# Patient Record
Sex: Male | Born: 1960 | Race: White | Hispanic: No | Marital: Single | Smoking: Former smoker
Health system: Southern US, Community
[De-identification: ages and names within clinical notes are randomized; demographics above are authoritative.]

## PROBLEM LIST (undated history)

## (undated) DIAGNOSIS — Z87442 Personal history of urinary calculi: Secondary | ICD-10-CM

## (undated) DIAGNOSIS — Z8619 Personal history of other infectious and parasitic diseases: Secondary | ICD-10-CM

## (undated) DIAGNOSIS — I10 Essential (primary) hypertension: Secondary | ICD-10-CM

## (undated) DIAGNOSIS — E785 Hyperlipidemia, unspecified: Secondary | ICD-10-CM

## (undated) DIAGNOSIS — E7800 Pure hypercholesterolemia, unspecified: Secondary | ICD-10-CM

## (undated) HISTORY — PX: HERNIA REPAIR: SHX51

## (undated) HISTORY — DX: Hyperlipidemia, unspecified: E78.5

## (undated) HISTORY — DX: Essential (primary) hypertension: I10

## (undated) HISTORY — DX: Pure hypercholesterolemia, unspecified: E78.00

---

## 2006-01-14 ENCOUNTER — Ambulatory Visit: Payer: Self-pay | Admitting: Otolaryngology

## 2009-04-13 ENCOUNTER — Emergency Department: Payer: Self-pay | Admitting: Emergency Medicine

## 2009-11-11 ENCOUNTER — Ambulatory Visit: Payer: Self-pay | Admitting: Urology

## 2011-10-21 ENCOUNTER — Other Ambulatory Visit: Payer: Self-pay | Admitting: Ophthalmology

## 2011-10-21 LAB — CBC WITH DIFFERENTIAL/PLATELET
Basophil #: 0.1 10*3/uL (ref 0.0–0.1)
Eosinophil #: 0.3 10*3/uL (ref 0.0–0.7)
Eosinophil %: 3.4 %
HCT: 44.3 % (ref 40.0–52.0)
HGB: 15.1 g/dL (ref 13.0–18.0)
Lymphocyte #: 1.7 10*3/uL (ref 1.0–3.6)
MCH: 31.9 pg (ref 26.0–34.0)
MCV: 94 fL (ref 80–100)
Monocyte #: 1.2 10*3/uL — ABNORMAL HIGH (ref 0.0–0.7)
Monocyte %: 11.9 %
Neutrophil #: 6.9 10*3/uL — ABNORMAL HIGH (ref 1.4–6.5)
Neutrophil %: 67.8 %
Platelet: 353 10*3/uL (ref 150–440)
RDW: 13.3 % (ref 11.5–14.5)
WBC: 10.2 10*3/uL (ref 3.8–10.6)

## 2012-10-10 HISTORY — PX: COLONOSCOPY: SHX174

## 2013-07-22 ENCOUNTER — Ambulatory Visit: Payer: Self-pay | Admitting: Gastroenterology

## 2014-03-13 ENCOUNTER — Emergency Department: Payer: Self-pay | Admitting: Emergency Medicine

## 2014-03-13 LAB — URINALYSIS, COMPLETE
Bacteria: NONE SEEN
Bilirubin,UR: NEGATIVE
Glucose,UR: NEGATIVE mg/dL (ref 0–75)
Ketone: NEGATIVE
LEUKOCYTE ESTERASE: NEGATIVE
Nitrite: NEGATIVE
Ph: 5 (ref 4.5–8.0)
Protein: 30
RBC,UR: 334 /HPF (ref 0–5)
Specific Gravity: 1.018 (ref 1.003–1.030)
WBC UR: 2 /HPF (ref 0–5)

## 2014-03-13 LAB — BASIC METABOLIC PANEL
Anion Gap: 4 — ABNORMAL LOW (ref 7–16)
BUN: 6 mg/dL — ABNORMAL LOW (ref 7–18)
CHLORIDE: 107 mmol/L (ref 98–107)
CO2: 30 mmol/L (ref 21–32)
Calcium, Total: 9.2 mg/dL (ref 8.5–10.1)
Creatinine: 1.09 mg/dL (ref 0.60–1.30)
EGFR (Non-African Amer.): >60
Glucose: 91 mg/dL (ref 65–99)
Osmolality: 278 (ref 275–301)
POTASSIUM: 3.7 mmol/L (ref 3.5–5.1)
SODIUM: 141 mmol/L (ref 136–145)

## 2014-03-13 LAB — CBC
HCT: 44.3 % (ref 40.0–52.0)
HGB: 15.3 g/dL (ref 13.0–18.0)
MCH: 32 pg (ref 26.0–34.0)
MCHC: 34.6 g/dL (ref 32.0–36.0)
MCV: 93 fL (ref 80–100)
Platelet: 302 10*3/uL (ref 150–440)
RBC: 4.78 10*6/uL (ref 4.40–5.90)
RDW: 13.3 % (ref 11.5–14.5)
WBC: 6.7 10*3/uL (ref 3.8–10.6)

## 2014-06-06 ENCOUNTER — Ambulatory Visit: Payer: Self-pay | Admitting: Family Medicine

## 2014-06-26 ENCOUNTER — Ambulatory Visit: Payer: Self-pay | Admitting: Family Medicine

## 2014-10-26 ENCOUNTER — Emergency Department: Payer: Self-pay | Admitting: Emergency Medicine

## 2014-10-26 LAB — CBC WITH DIFFERENTIAL/PLATELET
BASOS PCT: 0.3 %
Basophil #: 0 10*3/uL (ref 0.0–0.1)
EOS PCT: 1.3 %
Eosinophil #: 0.1 10*3/uL (ref 0.0–0.7)
HCT: 48.5 % (ref 40.0–52.0)
HGB: 16.3 g/dL (ref 13.0–18.0)
LYMPHS ABS: 0.2 10*3/uL — AB (ref 1.0–3.6)
Lymphocyte %: 2.2 %
MCH: 31.3 pg (ref 26.0–34.0)
MCHC: 33.6 g/dL (ref 32.0–36.0)
MCV: 93 fL (ref 80–100)
MONO ABS: 0.5 x10 3/mm (ref 0.2–1.0)
Monocyte %: 4.7 %
NEUTROS ABS: 9 10*3/uL — AB (ref 1.4–6.5)
Neutrophil %: 91.5 %
Platelet: 273 10*3/uL (ref 150–440)
RBC: 5.2 10*6/uL (ref 4.40–5.90)
RDW: 13.4 % (ref 11.5–14.5)
WBC: 9.8 10*3/uL (ref 3.8–10.6)

## 2014-10-26 LAB — COMPREHENSIVE METABOLIC PANEL
ALBUMIN: 3.7 g/dL (ref 3.4–5.0)
Alkaline Phosphatase: 93 U/L
Anion Gap: 9 (ref 7–16)
BUN: 12 mg/dL (ref 7–18)
Bilirubin,Total: 0.9 mg/dL (ref 0.2–1.0)
CALCIUM: 8.8 mg/dL (ref 8.5–10.1)
CHLORIDE: 104 mmol/L (ref 98–107)
CO2: 27 mmol/L (ref 21–32)
Creatinine: 1.31 mg/dL — ABNORMAL HIGH (ref 0.60–1.30)
EGFR (African American): >60
Glucose: 144 mg/dL — ABNORMAL HIGH (ref 65–99)
OSMOLALITY: 282 (ref 275–301)
POTASSIUM: 3.9 mmol/L (ref 3.5–5.1)
SGOT(AST): 32 U/L (ref 15–37)
SGPT (ALT): 46 U/L
SODIUM: 140 mmol/L (ref 136–145)
Total Protein: 7.6 g/dL (ref 6.4–8.2)

## 2014-10-26 LAB — URINALYSIS, COMPLETE
Bilirubin,UR: NEGATIVE
Glucose,UR: NEGATIVE mg/dL (ref 0–75)
LEUKOCYTE ESTERASE: NEGATIVE
Nitrite: NEGATIVE
PROTEIN: NEGATIVE
Ph: 5 (ref 4.5–8.0)
Specific Gravity: 1.02 (ref 1.003–1.030)
Squamous Epithelial: <1
WBC UR: <1 /HPF (ref 0–5)

## 2014-10-26 LAB — LIPASE, BLOOD: Lipase: 214 U/L (ref 73–393)

## 2014-10-26 LAB — TROPONIN I: Troponin-I: <0.02 ng/mL

## 2014-10-27 LAB — ED INFLUENZA
H1N1 flu by pcr: NOT DETECTED
Influenza A By PCR: NEGATIVE
Influenza B By PCR: NEGATIVE

## 2014-10-28 LAB — URINE CULTURE

## 2014-11-01 LAB — CULTURE, BLOOD (SINGLE)

## 2014-12-30 ENCOUNTER — Emergency Department: Payer: Self-pay | Admitting: Internal Medicine

## 2015-04-10 ENCOUNTER — Ambulatory Visit (INDEPENDENT_AMBULATORY_CARE_PROVIDER_SITE_OTHER): Payer: Managed Care, Other (non HMO) | Admitting: Family Medicine

## 2015-04-10 ENCOUNTER — Encounter: Payer: Self-pay | Admitting: Family Medicine

## 2015-04-10 VITALS — BP 122/70 | HR 60 | Temp 98.4°F | Ht 65.0 in | Wt 227.4 lb

## 2015-04-10 DIAGNOSIS — E785 Hyperlipidemia, unspecified: Secondary | ICD-10-CM | POA: Diagnosis not present

## 2015-04-10 DIAGNOSIS — R7989 Other specified abnormal findings of blood chemistry: Secondary | ICD-10-CM | POA: Insufficient documentation

## 2015-04-10 DIAGNOSIS — J90 Pleural effusion, not elsewhere classified: Secondary | ICD-10-CM | POA: Insufficient documentation

## 2015-04-10 DIAGNOSIS — R4586 Emotional lability: Secondary | ICD-10-CM | POA: Insufficient documentation

## 2015-04-10 DIAGNOSIS — I10 Essential (primary) hypertension: Secondary | ICD-10-CM

## 2015-04-10 DIAGNOSIS — R739 Hyperglycemia, unspecified: Secondary | ICD-10-CM | POA: Insufficient documentation

## 2015-04-10 DIAGNOSIS — E559 Vitamin D deficiency, unspecified: Secondary | ICD-10-CM | POA: Insufficient documentation

## 2015-04-10 MED ORDER — NEBIVOLOL HCL 10 MG PO TABS: 10.0000 mg | ORAL_TABLET | Freq: Every day | ORAL | Status: AC

## 2015-04-10 MED ORDER — SIMVASTATIN 40 MG PO TABS: 40.0000 mg | ORAL_TABLET | Freq: Every day | ORAL | Status: AC

## 2015-04-10 NOTE — Progress Notes (Signed)
Name: Larry Webb   MRN: [id number]    DOB: 1960-10-18   Date:04/10/2015       Progress Note  Subjective  Chief Complaint  Chief Complaint  Patient presents with  . Follow-up    Dr Jacqualine Code patient    Hypertension This is a chronic problem. The problem is controlled. Pertinent negatives include no chest pain, headaches, palpitations or shortness of breath. Past treatments include beta blockers. The current treatment provides significant improvement. There is no history of CAD/MI or CVA.  Hyperlipidemia This is a chronic problem. The problem is controlled. Recent lipid tests were reviewed and are normal. Pertinent negatives include no chest pain, leg pain, myalgias or shortness of breath. Current antihyperlipidemic treatment includes statins, diet change and exercise. The current treatment provides significant improvement of lipids. There are no compliance problems.  Risk factors for coronary artery disease include dyslipidemia, male sex and obesity.      Past Medical History  Diagnosis Date  . Hypertension   . Hyperlipidemia     History reviewed. No pertinent past surgical history.  Family History  Problem Relation Age of Onset  . Vision loss Mother   . Hearing loss Mother   . Pneumonia Father     History   Social History  . Marital Status: Single    Spouse Name: N/A  . Number of Children: N/A  . Years of Education: N/A   Occupational History  . Not on file.   Social History Main Topics  . Smoking status: Never Smoker   . Smokeless tobacco: Never Used  . Alcohol Use: 0.0 oz/week    0 Standard drinks or equivalent per week  . Drug Use: No  . Sexual Activity: Not on file   Other Topics Concern  . Not on file   Social History Narrative  . No narrative on file     Current outpatient prescriptions:  .  nebivolol (BYSTOLIC) 10 MG tablet, Take by mouth., Disp: , Rfl:  .  simvastatin (ZOCOR) 40 MG tablet, Take by mouth., Disp: , Rfl:  .  Cholecalciferol 1000  UNITS tablet, Take by mouth., Disp: , Rfl:  .  ferrous sulfate 324 (65 FE) MG TBEC, Take by mouth., Disp: , Rfl:  .  fluticasone (FLONASE) 50 MCG/ACT nasal spray, SPRAY ONCE IN EACH NOSTRIL ONCE DAILY, Disp: , Rfl: 19 .  MULTIPLE VITAMIN PO, Take by mouth., Disp: , Rfl:   Not on File   Review of Systems  Respiratory: Negative for shortness of breath.   Cardiovascular: Negative for chest pain and palpitations.  Musculoskeletal: Negative for myalgias.  Neurological: Negative for headaches.      Objective  Filed Vitals:   04/10/15 0824  BP: 122/70  Pulse: 60  Temp: 98.4 F (36.9 C)  TempSrc: Oral  Height: 5\' 5"  (1.651 m)  Weight: 227 lb 6.4 oz (103.148 kg)  SpO2: 96%    Physical Exam  Constitutional: He is oriented to person, place, and time and well-developed, well-nourished, and in no distress.  HENT:  Head: Normocephalic and atraumatic.  Cardiovascular: Normal rate and regular rhythm.   Pulmonary/Chest: Effort normal and breath sounds normal.  Neurological: He is alert and oriented to person, place, and time.  Skin: Skin is warm and dry.  Nursing note and vitals reviewed.      No results found for this or any previous visit (from the past 2160 hour(s)).   Assessment & Plan 1. Essential hypertension Blood Pressure controlled on present therapy. -  nebivolol (BYSTOLIC) 10 MG tablet; Take 1 tablet (10 mg total) by mouth daily.  Dispense: 90 tablet; Refill: 1  2. HLD (hyperlipidemia) Lipid panel from March 2016 was reviewed and is at goal. Continue simvastatin 40 mg at bedtime and repeat fasting lipid panel in December 2016. - simvastatin (ZOCOR) 40 MG tablet; Take 1 tablet (40 mg total) by mouth daily at 6 PM.  Dispense: 90 tablet; Refill: 1  .  [name] Asad A[facility] 04/10/2015 8:40 AM

## 2015-06-05 IMAGING — CT CT NECK WITH CONTRAST
3 of 5 series · 13 of 33 positions shown, 16 images · IV contrast (omnipaque)
Comparison: Chest radiograph performed 10/26/2014

CLINICAL DATA: Acute onset of throat swelling, particularly at the
soft palate. Thrown scratchiness. Initial encounter.

EXAM:
CT NECK WITH CONTRAST
TECHNIQUE: Multidetector CT imaging of the neck was performed using the
standard protocol following the bolus administration of intravenous
contrast.
CONTRAST:  75 mL of Omnipaque 300 IV contrast

[Series 5: sag neck · sagittal · 0.61mm/px · 5 of 147 slices shown, 6 images]
[im 49/147  bone]
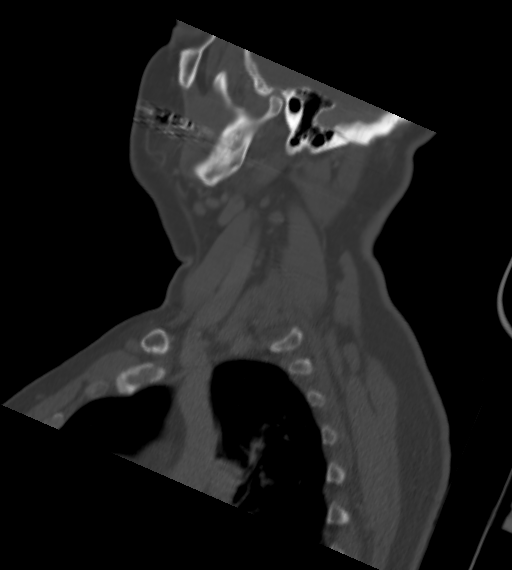
[im 61/147  bone]
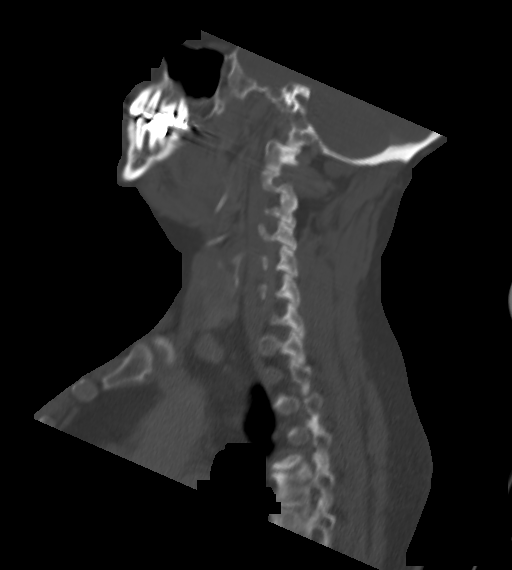
[im 74/147  soft-tissue]
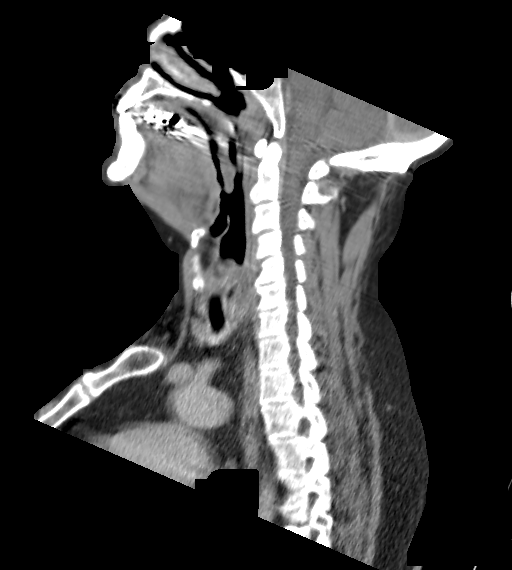
[im 74/147  bone]
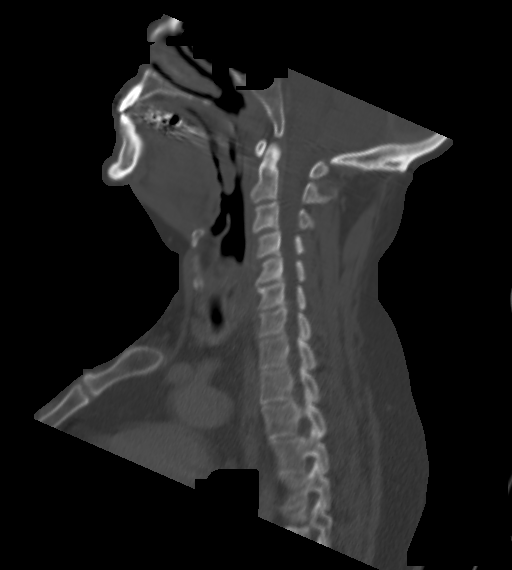
[im 86/147  bone]
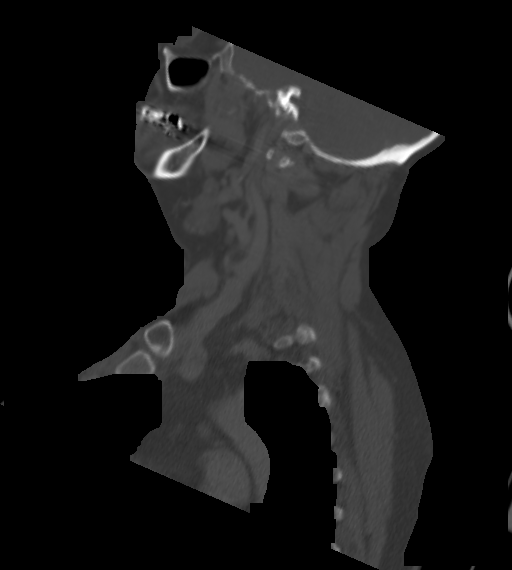
[im 98/147  bone]
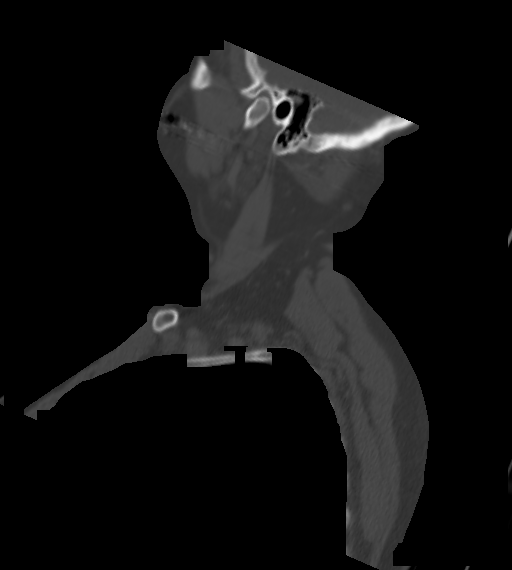

[Series 6: cor neck · coronal · 0.63mm/px · 3 of 122 slices shown]
[im 42/122  bone]
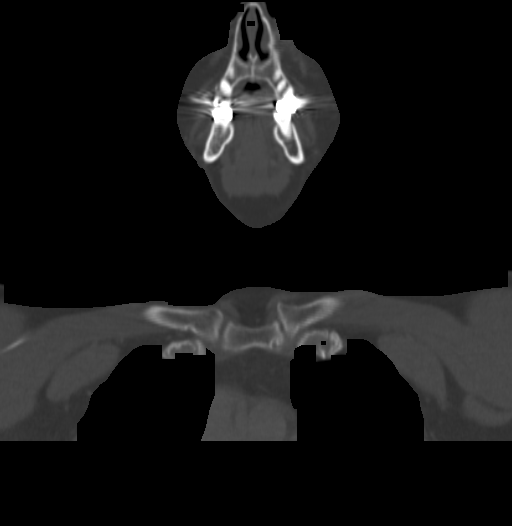
[im 55/122  bone]
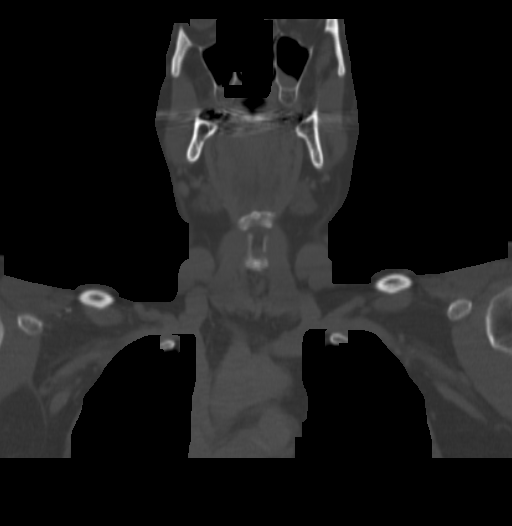
[im 68/122  bone]
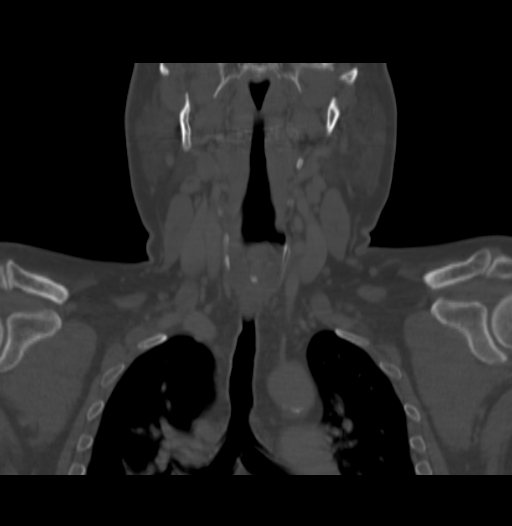

[Series 7: ax oropharynx · axial · 0.50mm/px · z∈[-372,-162]mm · 5 of 174 slices shown, 7 images]
[im 29/174  soft-tissue]
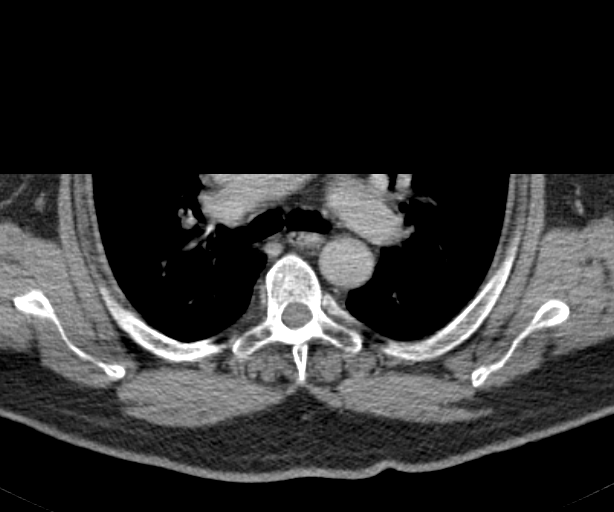
[im 29/174  bone]
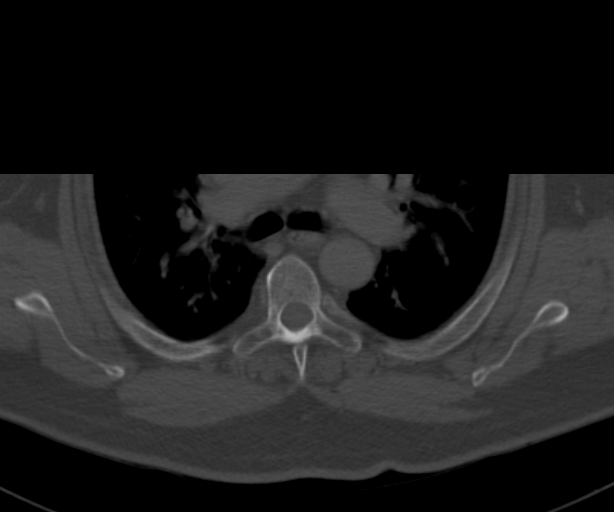
[im 58/174  bone]
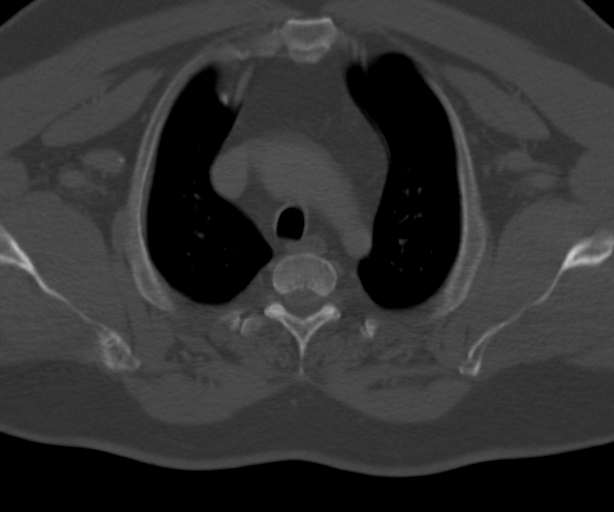
[im 87/174  bone]
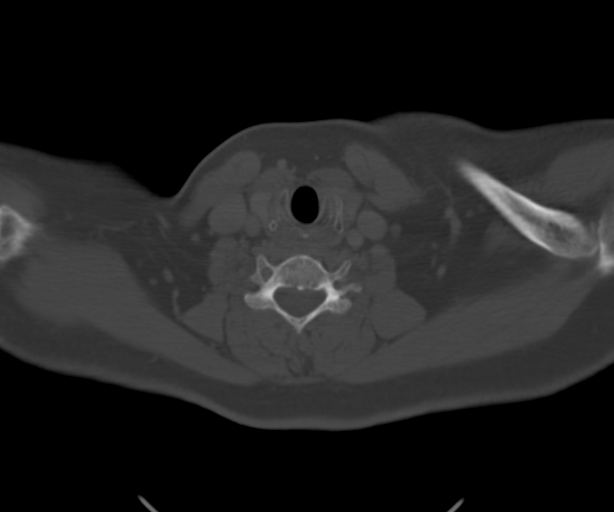
[im 116/174  bone]
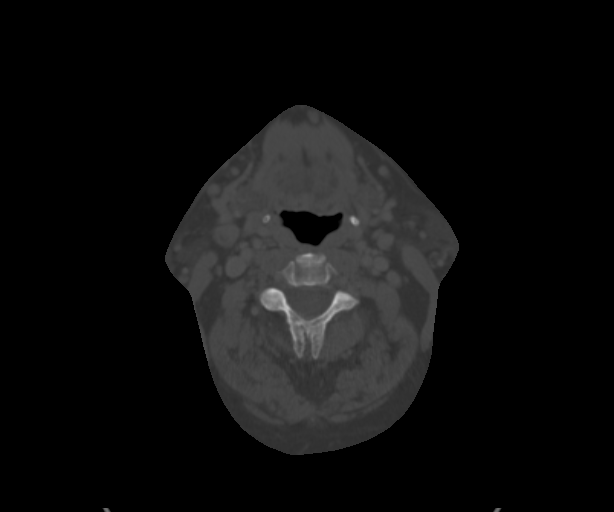
[im 145/174  soft-tissue]
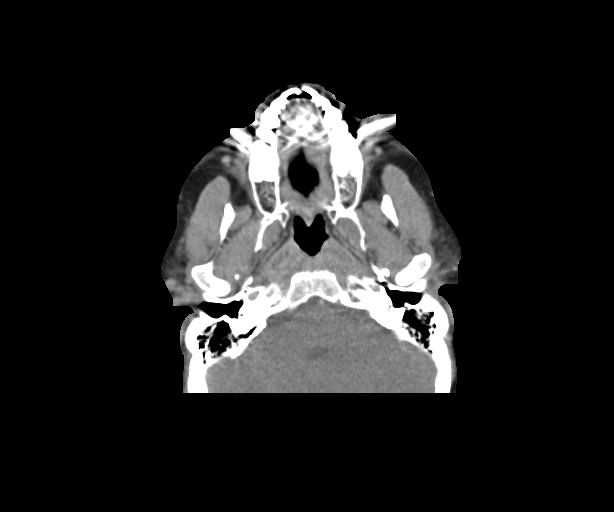
[im 145/174  bone]
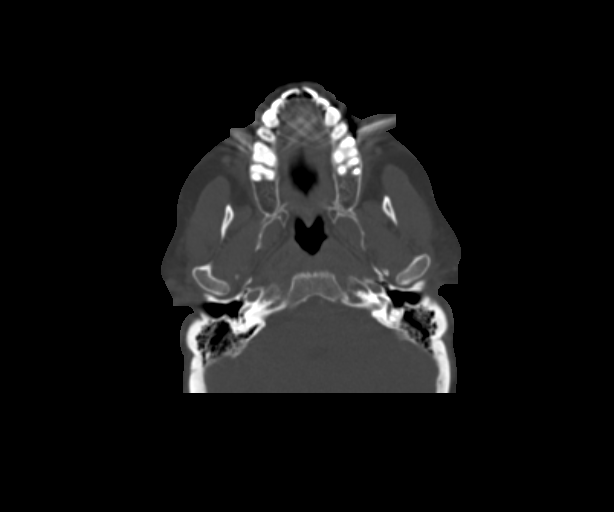

[13 of 33 positions shown; findings below may reference images not displayed]

FINDINGS: Pharynx and larynx: The nasopharynx, oropharynx and hypopharynx are
grossly unremarkable in appearance. The adenoids and palatine
tonsils are grossly unremarkable. The valleculae and piriform
sinuses are within normal limits. The proximal trachea is
unremarkable in appearance. The parapharyngeal fat planes are
preserved. Prevertebral soft tissues are within normal limits.

Salivary glands: The parotid and submandibular glands are symmetric
and unremarkable in appearance.

Thyroid: The thyroid gland is unremarkable in appearance.

Lymph nodes: A borderline prominent 1.1 cm right submandibular node
is noted. Additional visualized cervical nodes remain normal in
size.

Vascular: Visualized vasculature is unremarkable in appearance.
There is no evidence of vascular compromise. The visualized
intracranial vasculature is grossly unremarkable.

Limited intracranial: The visualized portions of the brain are
unremarkable in appearance.

Visualized orbits: The visualized portions of the orbits are within
normal limits.

Mastoids and visualized paranasal sinuses: Mucosal thickening is
noted at the maxillary sinuses bilaterally. The remaining visualized
paranasal sinuses and mastoid air cells are well-aerated.

Skeleton: No acute osseous abnormalities are seen. The visualized
dentition is grossly unremarkable, though difficult to fully assess.

Upper chest: The visualized portions of the lungs are clear. The
superior mediastinum is unremarkable in appearance. Incidental note
is made of a direct origin of the left vertebral artery from the
aortic arch. Note is made of a partially characterized 4.8 cm lipoma
at the right axilla.
IMPRESSION: 1. No acute abnormality seen to explain the patient's symptoms.
2. Borderline prominent 1.1 cm right submandibular node, nonspecific
and likely benign.
3. Mucosal thickening at the maxillary sinuses bilaterally.
4. Partially characterized 4.8 cm lipoma noted at the right axilla.

## 2017-12-22 ENCOUNTER — Other Ambulatory Visit: Payer: Self-pay | Admitting: Internal Medicine

## 2017-12-22 DIAGNOSIS — R3129 Other microscopic hematuria: Secondary | ICD-10-CM

## 2017-12-22 DIAGNOSIS — N200 Calculus of kidney: Secondary | ICD-10-CM

## 2018-01-03 ENCOUNTER — Ambulatory Visit
Admission: RE | Admit: 2018-01-03 | Discharge: 2018-01-03 | Disposition: A | Discharge status: Home / Self Care | Payer: Managed Care, Other (non HMO) | Source: Ambulatory Visit | Attending: Internal Medicine | Admitting: Internal Medicine

## 2018-01-03 DIAGNOSIS — N200 Calculus of kidney: Secondary | ICD-10-CM | POA: Diagnosis present

## 2018-01-03 DIAGNOSIS — N132 Hydronephrosis with renal and ureteral calculous obstruction: Secondary | ICD-10-CM | POA: Insufficient documentation

## 2018-01-03 DIAGNOSIS — I10 Essential (primary) hypertension: Secondary | ICD-10-CM | POA: Insufficient documentation

## 2018-01-03 DIAGNOSIS — R3129 Other microscopic hematuria: Secondary | ICD-10-CM | POA: Insufficient documentation

## 2018-01-08 ENCOUNTER — Other Ambulatory Visit: Payer: Self-pay

## 2018-01-12 ENCOUNTER — Ambulatory Visit
Admission: RE | Admit: 2018-01-12 | Discharge: 2018-01-12 | Disposition: A | Discharge status: Home / Self Care | Payer: Managed Care, Other (non HMO) | Source: Ambulatory Visit | Attending: Urology | Admitting: Urology

## 2018-01-12 ENCOUNTER — Ambulatory Visit: Payer: Managed Care, Other (non HMO) | Admitting: Urology

## 2018-01-12 ENCOUNTER — Encounter: Payer: Self-pay | Admitting: Urology

## 2018-01-12 VITALS — BP 134/78 | HR 61 | Ht 64.0 in | Wt 231.0 lb

## 2018-01-12 DIAGNOSIS — N202 Calculus of kidney with calculus of ureter: Secondary | ICD-10-CM | POA: Insufficient documentation

## 2018-01-12 DIAGNOSIS — N200 Calculus of kidney: Secondary | ICD-10-CM | POA: Diagnosis not present

## 2018-01-12 DIAGNOSIS — N201 Calculus of ureter: Secondary | ICD-10-CM

## 2018-01-12 DIAGNOSIS — N132 Hydronephrosis with renal and ureteral calculous obstruction: Secondary | ICD-10-CM | POA: Diagnosis not present

## 2018-01-12 LAB — URINALYSIS, COMPLETE
Bilirubin, UA: NEGATIVE
GLUCOSE, UA: NEGATIVE
Ketones, UA: NEGATIVE
LEUKOCYTES UA: NEGATIVE
Nitrite, UA: NEGATIVE
PROTEIN UA: NEGATIVE
Specific Gravity, UA: 1.025 (ref 1.005–1.030)
UUROB: 0.2 mg/dL (ref 0.2–1.0)
pH, UA: 5.5 (ref 5.0–7.5)

## 2018-01-12 LAB — MICROSCOPIC EXAMINATION
Bacteria, UA: NONE SEEN
Epithelial Cells (non renal): NONE SEEN /hpf (ref 0–10)
WBC UA: NONE SEEN /HPF (ref 0–5)

## 2018-01-12 NOTE — Progress Notes (Signed)
01/12/2018 4:21 PM   [name] [birth year] [id number]  Referring provider: Adin Hector, MD [facility] [place], [place] [address]  Chief Complaint  Patient presents with  . Hydronephrosis  . Follow-up    HPI: Larry Webb is a 57 year old male seen in consultation at the request of Dr. Caryl Comes for evaluation of nephrolithiasis and hydronephrosis.  He relates to a history of stone disease.  He recently saw Dr. Caryl Comes and brought in a stone he had passed which was sent for analysis and was 95% calcium oxalate monohydrate and 5% calcium phosphate.  He has had nagging right flank pain for the past 2-3 months.  He was found to have persistent microhematuria and a stone protocol CT of the abdomen and pelvis was performed on 01/03/2018 which showed a 6 mm lower right proximal ureteral calculus with moderate hydronephrosis/hydroureter.  He also had bilateral nonobstructing renal calculi measuring up to 4 mm in the right and 3 mm in the left.  He denies bothersome lower urinary tract symptoms, dysuria or gross hematuria.  His flank pain is rated mild without identifiable precipitating, aggravating or alleviating factors.  He denies fever, chills, nausea, vomiting.  He denies gross hematuria.  He has never required surgical intervention for his urinary calculi.   PMH: Past Medical History:  Diagnosis Date  . High cholesterol   . Hyperlipidemia   . Hypertension     Surgical History: Past Surgical History:  Procedure Laterality Date  . COLONOSCOPY  2014  . HERNIA REPAIR     right inguinal    Home Medications:  Allergies as of 01/12/2018   Not on File     Medication List        Accurate as of 01/12/18  4:21 PM. Always use your most recent med list.          MULTIPLE VITAMIN PO Take by mouth.   nebivolol 10 MG tablet Commonly known as:  BYSTOLIC Take 1 tablet (10 mg total) by mouth daily.   PROBIOTIC-10 PO Take by mouth.   simvastatin 40  MG tablet Commonly known as:  ZOCOR Take 1 tablet (40 mg total) by mouth daily at 6 PM.   St Johns Wort 1000 MG Caps Take by mouth.       Allergies: Not on File  Family History: Family History  Problem Relation Age of Onset  . Vision loss Mother   . Hearing loss Mother   . Pneumonia Father     Social History:  reports that he has quit smoking. He has never used smokeless tobacco. He reports that he drinks alcohol. He reports that he does not use drugs.  ROS: UROLOGY Frequent Urination?: No Hard to postpone urination?: No Burning/pain with urination?: No Get up at night to urinate?: No Leakage of urine?: No Urine stream starts and stops?: No Trouble starting stream?: No Do you have to strain to urinate?: No Blood in urine?: No Urinary tract infection?: No Sexually transmitted disease?: No Injury to kidneys or bladder?: No Painful intercourse?: No Weak stream?: No Erection problems?: No Penile pain?: No  Gastrointestinal Nausea?: No Vomiting?: No Indigestion/heartburn?: No Diarrhea?: No Constipation?: No  Constitutional Fever: No Night sweats?: No Weight loss?: No Fatigue?: Yes  Skin Skin rash/lesions?: No Itching?: No  Eyes Blurred vision?: No Double vision?: No  Ears/Nose/Throat Sore throat?: No Sinus problems?: No  Hematologic/Lymphatic Swollen glands?: No Easy bruising?: No  Cardiovascular Leg swelling?: No Chest pain?: No  Respiratory Cough?: No Shortness of breath?: No  Endocrine Excessive thirst?: No  Musculoskeletal Back pain?: No Joint pain?: No  Neurological Headaches?: No Dizziness?: No  Psychologic Depression?: Yes Anxiety?: Yes  Physical Exam: BP 134/78   Pulse 61   Ht 5\' 4"  (1.626 m)   Wt 231 lb (104.8 kg)   BMI 39.65 kg/m   Constitutional:  Alert and oriented, No acute distress. HEENT: [place] AT, moist mucus membranes.  Trachea midline, no masses. Cardiovascular: No clubbing, cyanosis, or edema.   RRR Respiratory: Normal respiratory effort, no increased work of breathing.  Lungs clear  GI: Abdomen is soft, nontender, nondistended, no abdominal masses GU: No CVA tenderness Lymph: No cervical or inguinal lymphadenopathy. Skin: No rashes, bruises or suspicious lesions. Neurologic: Grossly intact, no focal deficits, moving all 4 extremities. Psychiatric: Normal mood and affect.  Laboratory Data: Lab Results  Component Value Date   WBC 9.8 10/26/2014   HGB 16.3 10/26/2014   HCT 48.5 10/26/2014   MCV 93 10/26/2014   PLT 273 10/26/2014    Lab Results  Component Value Date   CREATININE 1.31 (H) 10/26/2014   Urinalysis No significant abnormalities on dipstick or microscopy  Pertinent Imaging: CT images were personally reviewed Results for orders placed during the hospital encounter of 01/03/18  CT RENAL STONE STUDY   Narrative CLINICAL DATA:  Microhematuria.  History of nephrolithiasis.  EXAM: CT ABDOMEN AND PELVIS WITHOUT CONTRAST  TECHNIQUE: Multidetector CT imaging of the abdomen and pelvis was performed following the standard protocol without IV contrast.  COMPARISON:  11/11/2009 CT abdomen/pelvis.  FINDINGS: Lower chest: No significant pulmonary nodules or acute consolidative airspace disease.  Hepatobiliary: Normal liver size. Several scattered simple liver cysts, largest 2.6 cm at the right liver dome. No additional liver lesions normal gallbladder with no radiopaque cholelithiasis. No biliary ductal dilatation.  Pancreas: Normal, with no mass or duct dilation.  Spleen: Normal size. No mass.  Adrenals/Urinary Tract: Normal adrenals. Obstructing 6 mm stone in the right lumbar ureter at the L5 level, with mild to moderate right hydroureteronephrosis. No left hydronephrosis. Scattered nonobstructing stones throughout the left greater than right kidneys, largest 4 mm in the lower right kidney and 3 mm in the upper left kidney. Exophytic simple 1.3 cm  anterior upper left renal cyst. No additional contour deforming renal lesions. Normal caliber left ureter. No additional ureteral stones. Normal bladder.  Stomach/Bowel: Normal non-distended stomach. Normal caliber small bowel with no small bowel wall thickening. Normal appendix. Normal large bowel with no diverticulosis, large bowel wall thickening or pericolonic fat stranding.  Vascular/Lymphatic: Normal caliber abdominal aorta. No pathologically enlarged lymph nodes in the abdomen or pelvis.  Reproductive: Normal size prostate with nonspecific coarse internal prostatic calcification.  Other: No pneumoperitoneum, ascites or focal fluid collection. Prior right inguinal hernia repair, with no evidence of a recurrent right inguinal hernia. Probable tiny fat containing left inguinal hernia. Small fat containing umbilical hernia.  Musculoskeletal: No aggressive appearing focal osseous lesions. Mild thoracolumbar spondylosis.  IMPRESSION: 1. Obstructing 6 mm stone in the lower lumbar segment of the right ureter, with mild to moderate right hydroureteronephrosis. 2. Several additional nonobstructing stones in the left greater than right kidneys.  These results will be called to the ordering clinician or representative by the Radiologist Assistant, and communication documented in the PACS or zVision Dashboard.   Electronically Signed   By: Ilona Sorrel M.D.   On: 01/04/2018 08:19     Assessment & Plan:   57 year old male with  an obstructing right ureteral calculus with hydronephrosis and bilateral nephrolithiasis.  I reviewed Larry Webb's CT with him.  Treatment of his right ureteral calculus was recommended.  I discussed both ureteroscopic removal and shockwave lithotripsy.  He was informed his stone would need to be visualized on plain x-ray in order for lithotripsy to be performed.  He was sent for KUB and will be notified with the results.  The pros and cons of each treatment  were discussed and he is leaning toward shockwave lithotripsy.  His stone density did measure greater than 1000 HU and he was informed this procedure would be less successful.   1. Ureteral calculus  - Abdomen 1 view (KUB); Future  2. Hydronephrosis with urinary obstruction due to ureteral calculus  - Urinalysis, Complete  3. Nephrolithiasis   Abbie Sons, MD  [facility] [address], [place], [place] [address] [phone]

## 2018-01-12 NOTE — H&P (View-Only) (Signed)
01/12/2018 4:21 PM   Larry Webb 1961/06/22 [id number]  Referring provider: Adin Hector, MD [place] [facility] [place], [place] [address]  Chief Complaint  Patient presents with  . Hydronephrosis  . Follow-up    HPI: Larry Webb is a 57 year old male seen in consultation at the request of Dr. Caryl Comes for evaluation of nephrolithiasis and hydronephrosis.  He relates to a history of stone disease.  He recently saw Dr. Caryl Comes and brought in a stone he had passed which was sent for analysis and was 95% calcium oxalate monohydrate and 5% calcium phosphate.  He has had nagging right flank pain for the past 2-3 months.  He was found to have persistent microhematuria and a stone protocol CT of the abdomen and pelvis was performed on 01/03/2018 which showed a 6 mm lower right proximal ureteral calculus with moderate hydronephrosis/hydroureter.  He also had bilateral nonobstructing renal calculi measuring up to 4 mm in the right and 3 mm in the left.  He denies bothersome lower urinary tract symptoms, dysuria or gross hematuria.  His flank pain is rated mild without identifiable precipitating, aggravating or alleviating factors.  He denies fever, chills, nausea, vomiting.  He denies gross hematuria.  He has never required surgical intervention for his urinary calculi.   PMH: Past Medical History:  Diagnosis Date  . High cholesterol   . Hyperlipidemia   . Hypertension     Surgical History: Past Surgical History:  Procedure Laterality Date  . COLONOSCOPY  2014  . HERNIA REPAIR     right inguinal    Home Medications:  Allergies as of 01/12/2018   Not on File     Medication List        Accurate as of 01/12/18  4:21 PM. Always use your most recent med list.          MULTIPLE VITAMIN PO Take by mouth.   nebivolol 10 MG tablet Commonly known as:  BYSTOLIC Take 1 tablet (10 mg total) by mouth daily.   PROBIOTIC-10 PO Take by mouth.   simvastatin 40  MG tablet Commonly known as:  ZOCOR Take 1 tablet (40 mg total) by mouth daily at 6 PM.   St Johns Wort 1000 MG Caps Take by mouth.       Allergies: Not on File  Family History: Family History  Problem Relation Age of Onset  . Vision loss Mother   . Hearing loss Mother   . Pneumonia Father     Social History:  reports that he has quit smoking. He has never used smokeless tobacco. He reports that he drinks alcohol. He reports that he does not use drugs.  ROS: UROLOGY Frequent Urination?: No Hard to postpone urination?: No Burning/pain with urination?: No Get up at night to urinate?: No Leakage of urine?: No Urine stream starts and stops?: No Trouble starting stream?: No Do you have to strain to urinate?: No Blood in urine?: No Urinary tract infection?: No Sexually transmitted disease?: No Injury to kidneys or bladder?: No Painful intercourse?: No Weak stream?: No Erection problems?: No Penile pain?: No  Gastrointestinal Nausea?: No Vomiting?: No Indigestion/heartburn?: No Diarrhea?: No Constipation?: No  Constitutional Fever: No Night sweats?: No Weight loss?: No Fatigue?: Yes  Skin Skin rash/lesions?: No Itching?: No  Eyes Blurred vision?: No Double vision?: No  Ears/Nose/Throat Sore throat?: No Sinus problems?: No  Hematologic/Lymphatic Swollen glands?: No Easy bruising?: No  Cardiovascular Leg swelling?: No Chest pain?: No  Respiratory Cough?: No Shortness of breath?: No  Endocrine Excessive thirst?: No  Musculoskeletal Back pain?: No Joint pain?: No  Neurological Headaches?: No Dizziness?: No  Psychologic Depression?: Yes Anxiety?: Yes  Physical Exam: BP 134/78   Pulse 61   Ht 5\' 4"  (1.626 m)   Wt 231 lb (104.8 kg)   BMI 39.65 kg/m   Constitutional:  Alert and oriented, No acute distress. HEENT: [place] AT, moist mucus membranes.  Trachea midline, no masses. Cardiovascular: No clubbing, cyanosis, or edema.   RRR Respiratory: Normal respiratory effort, no increased work of breathing.  Lungs clear  GI: Abdomen is soft, nontender, nondistended, no abdominal masses GU: No CVA tenderness Lymph: No cervical or inguinal lymphadenopathy. Skin: No rashes, bruises or suspicious lesions. Neurologic: Grossly intact, no focal deficits, moving all 4 extremities. Psychiatric: Normal mood and affect.  Laboratory Data: Lab Results  Component Value Date   WBC 9.8 10/26/2014   HGB 16.3 10/26/2014   HCT 48.5 10/26/2014   MCV 93 10/26/2014   PLT 273 10/26/2014    Lab Results  Component Value Date   CREATININE 1.31 (H) 10/26/2014   Urinalysis No significant abnormalities on dipstick or microscopy  Pertinent Imaging: CT images were personally reviewed Results for orders placed during the hospital encounter of 01/03/18  CT RENAL STONE STUDY   Narrative CLINICAL DATA:  Microhematuria.  History of nephrolithiasis.  EXAM: CT ABDOMEN AND PELVIS WITHOUT CONTRAST  TECHNIQUE: Multidetector CT imaging of the abdomen and pelvis was performed following the standard protocol without IV contrast.  COMPARISON:  11/11/2009 CT abdomen/pelvis.  FINDINGS: Lower chest: No significant pulmonary nodules or acute consolidative airspace disease.  Hepatobiliary: Normal liver size. Several scattered simple liver cysts, largest 2.6 cm at the right liver dome. No additional liver lesions normal gallbladder with no radiopaque cholelithiasis. No biliary ductal dilatation.  Pancreas: Normal, with no mass or duct dilation.  Spleen: Normal size. No mass.  Adrenals/Urinary Tract: Normal adrenals. Obstructing 6 mm stone in the right lumbar ureter at the L5 level, with mild to moderate right hydroureteronephrosis. No left hydronephrosis. Scattered nonobstructing stones throughout the left greater than right kidneys, largest 4 mm in the lower right kidney and 3 mm in the upper left kidney. Exophytic simple 1.3 cm  anterior upper left renal cyst. No additional contour deforming renal lesions. Normal caliber left ureter. No additional ureteral stones. Normal bladder.  Stomach/Bowel: Normal non-distended stomach. Normal caliber small bowel with no small bowel wall thickening. Normal appendix. Normal large bowel with no diverticulosis, large bowel wall thickening or pericolonic fat stranding.  Vascular/Lymphatic: Normal caliber abdominal aorta. No pathologically enlarged lymph nodes in the abdomen or pelvis.  Reproductive: Normal size prostate with nonspecific coarse internal prostatic calcification.  Other: No pneumoperitoneum, ascites or focal fluid collection. Prior right inguinal hernia repair, with no evidence of a recurrent right inguinal hernia. Probable tiny fat containing left inguinal hernia. Small fat containing umbilical hernia.  Musculoskeletal: No aggressive appearing focal osseous lesions. Mild thoracolumbar spondylosis.  IMPRESSION: 1. Obstructing 6 mm stone in the lower lumbar segment of the right ureter, with mild to moderate right hydroureteronephrosis. 2. Several additional nonobstructing stones in the left greater than right kidneys.  These results will be called to the ordering clinician or representative by the Radiologist Assistant, and communication documented in the PACS or zVision Dashboard.   Electronically Signed   By: Ilona Sorrel M.D.   On: 01/04/2018 08:19     Assessment & Plan:   57 year old male with  an obstructing right ureteral calculus with hydronephrosis and bilateral nephrolithiasis.  I reviewed Larry Webb's CT with him.  Treatment of his right ureteral calculus was recommended.  I discussed both ureteroscopic removal and shockwave lithotripsy.  He was informed his stone would need to be visualized on plain x-ray in order for lithotripsy to be performed.  He was sent for KUB and will be notified with the results.  The pros and cons of each treatment  were discussed and he is leaning toward shockwave lithotripsy.  His stone density did measure greater than 1000 HU and he was informed this procedure would be less successful.   1. Ureteral calculus  - Abdomen 1 view (KUB); Future  2. Hydronephrosis with urinary obstruction due to ureteral calculus  - Urinalysis, Complete  3. Nephrolithiasis   Abbie Sons, MD  [place] [address] [place], [place] [address] [phone]

## 2018-01-15 ENCOUNTER — Telehealth: Payer: Self-pay

## 2018-01-15 NOTE — Telephone Encounter (Signed)
Spoke with pt in reference to KUB results and needing URS. Pt stated that he has not passed his stone.

## 2018-01-15 NOTE — Telephone Encounter (Signed)
-----   Message from Abbie Sons, MD sent at 01/14/2018 11:06 AM EDT ----- The right ureteral calculus is not seen on KUB.  He therefore would not be a candidate for shockwave lithotripsy.  Ureteroscopy would be the best option.  He has not passed this stone has he?

## 2018-01-16 NOTE — Telephone Encounter (Signed)
Please advise. Will need orders please.

## 2018-01-16 NOTE — Telephone Encounter (Signed)
Orders completed

## 2018-01-17 ENCOUNTER — Other Ambulatory Visit: Payer: Self-pay | Admitting: Radiology

## 2018-01-17 DIAGNOSIS — N200 Calculus of kidney: Secondary | ICD-10-CM

## 2018-01-17 DIAGNOSIS — N201 Calculus of ureter: Secondary | ICD-10-CM

## 2018-01-23 ENCOUNTER — Other Ambulatory Visit: Payer: Managed Care, Other (non HMO)

## 2018-01-24 ENCOUNTER — Encounter
Admission: RE | Admit: 2018-01-24 | Discharge: 2018-01-24 | Disposition: A | Discharge status: Home / Self Care | Payer: Managed Care, Other (non HMO) | Source: Ambulatory Visit | Attending: Urology | Admitting: Urology

## 2018-01-24 ENCOUNTER — Other Ambulatory Visit: Payer: Self-pay

## 2018-01-24 DIAGNOSIS — Z01810 Encounter for preprocedural cardiovascular examination: Secondary | ICD-10-CM | POA: Insufficient documentation

## 2018-01-24 DIAGNOSIS — Z01812 Encounter for preprocedural laboratory examination: Secondary | ICD-10-CM | POA: Diagnosis present

## 2018-01-24 DIAGNOSIS — I10 Essential (primary) hypertension: Secondary | ICD-10-CM | POA: Insufficient documentation

## 2018-01-24 DIAGNOSIS — E785 Hyperlipidemia, unspecified: Secondary | ICD-10-CM | POA: Diagnosis not present

## 2018-01-24 DIAGNOSIS — R001 Bradycardia, unspecified: Secondary | ICD-10-CM | POA: Insufficient documentation

## 2018-01-24 HISTORY — DX: Personal history of other infectious and parasitic diseases: Z86.19

## 2018-01-24 HISTORY — DX: Personal history of urinary calculi: Z87.442

## 2018-01-24 LAB — URINALYSIS, ROUTINE W REFLEX MICROSCOPIC
BACTERIA UA: NONE SEEN
BILIRUBIN URINE: NEGATIVE
Glucose, UA: NEGATIVE mg/dL
Hgb urine dipstick: NEGATIVE
KETONES UR: NEGATIVE mg/dL
LEUKOCYTES UA: NEGATIVE
Nitrite: NEGATIVE
PH: 6 (ref 5.0–8.0)
Protein, ur: 30 mg/dL — AB
Specific Gravity, Urine: 1.024 (ref 1.005–1.030)

## 2018-01-24 NOTE — Patient Instructions (Signed)
Your procedure is scheduled on: Tuesday 01/30/18 Report to [facility]. To find out your arrival time please call [phone] between 1PM - 3PM on Monday 01/29/18.  Remember: Instructions that are not followed completely may result in serious medical risk, up to and including death, or upon the discretion of your surgeon and anesthesiologist your surgery may need to be rescheduled.     _X__ 1. Do not eat food after midnight the night before your procedure.                 No gum chewing or hard candies. You may drink clear liquids up to 2 hours                 before you are scheduled to arrive for your surgery- DO not drink clear                 liquids within 2 hours of the start of your surgery.                 Clear Liquids include:  water, apple juice without pulp, clear carbohydrate                 drink such as Clearfast or Gatorade, Black Coffee or Tea (Do not add                 anything to coffee or tea).  __X__2.  On the morning of surgery brush your teeth with toothpaste and water, you                 may rinse your mouth with mouthwash if you wish.  Do not swallow any              toothpaste of mouthwash.     _X__ 3.  No Alcohol for 24 hours before or after surgery.   _X__ 4.  Do Not Smoke or use e-cigarettes For 24 Hours Prior to Your Surgery.                 Do not use any chewable tobacco products for at least 6 hours prior to                 surgery.  ____  5.  Bring all medications with you on the day of surgery if instructed.   __X__  6.  Notify your doctor if there is any change in your medical condition      (cold, fever, infections).     Do not wear jewelry, make-up, hairpins, clips or nail polish. Do not wear lotions, powders, or perfumes.  Do not shave 48 hours prior to surgery. Men may shave face and neck. Do not bring valuables to the hospital.    [facility] is not responsible for any belongings or  valuables.  Contacts, dentures/partials or body piercings may not be worn into surgery. Bring a case for your contacts, glasses or hearing aids, a denture cup will be supplied. Leave your suitcase in the car. After surgery it may be brought to your room. For patients admitted to the hospital, discharge time is determined by your treatment team.   Patients discharged the day of surgery will not be allowed to drive home.   Please read over the following fact sheets that you were given:   MRSA Information  __X__ Take these medicines the morning of surgery with A SIP OF WATER:  1. BYSTOLIC  2. SIMVASTATIN  3. VALTREX  4.  5.  6.  ____ Fleet Enema (as directed)   __ __ Use CHG Soap/SAGE wipes as directed  ____ Use inhalers on the day of surgery  ____ Stop metformin/Janumet/Farxiga 2 days prior to surgery    ____ Take 1/2 of usual insulin dose the night before surgery. No insulin the morning          of surgery.   ____ Stop Blood Thinners Coumadin/Plavix/Xarelto/Pleta/Pradaxa/Eliquis/Effient/Aspirin  on   Or contact your Surgeon, Cardiologist or Medical Doctor regarding  ability to stop your blood thinners  __X__ Stop Anti-inflammatories 7 days before surgery such as Advil, Ibuprofen, Motrin,  BC or Goodies Powder, Naprosyn, Naproxen, Aleve, Aspirin    __X__ Stopall herbal supplements, fish oil or vitamin E until after surgery.  [place] WART STOP TODAY  ____ Bring C-Pap to the hospital.

## 2018-01-24 NOTE — Pre-Procedure Instructions (Signed)
Labs noted in [place] from 12/2017.

## 2018-01-25 ENCOUNTER — Other Ambulatory Visit: Payer: Self-pay | Admitting: Radiology

## 2018-01-25 DIAGNOSIS — N200 Calculus of kidney: Secondary | ICD-10-CM

## 2018-01-25 LAB — URINE CULTURE: CULTURE: NO GROWTH

## 2018-01-29 MED ORDER — CEFAZOLIN SODIUM-DEXTROSE 2-4 GM/100ML-% IV SOLN
2.0000 g | INTRAVENOUS | Status: AC
  Administered 2018-01-30: 2 g via INTRAVENOUS
  Filled 2018-01-29: qty 100

## 2018-01-30 ENCOUNTER — Encounter
Admission: RE | Disposition: A | Discharge status: Home / Self Care | Payer: Self-pay | Source: Ambulatory Visit | Attending: Urology

## 2018-01-30 ENCOUNTER — Ambulatory Visit: Payer: Managed Care, Other (non HMO) | Admitting: Certified Registered"

## 2018-01-30 ENCOUNTER — Other Ambulatory Visit: Payer: Self-pay

## 2018-01-30 ENCOUNTER — Ambulatory Visit
Admission: RE | Admit: 2018-01-30 | Discharge: 2018-01-30 | Disposition: A | Discharge status: Home / Self Care | Payer: Managed Care, Other (non HMO) | Source: Ambulatory Visit | Attending: Urology | Admitting: Urology

## 2018-01-30 ENCOUNTER — Encounter: Payer: Self-pay | Admitting: *Deleted

## 2018-01-30 DIAGNOSIS — Z8619 Personal history of other infectious and parasitic diseases: Secondary | ICD-10-CM | POA: Diagnosis not present

## 2018-01-30 DIAGNOSIS — Z79899 Other long term (current) drug therapy: Secondary | ICD-10-CM | POA: Diagnosis not present

## 2018-01-30 DIAGNOSIS — I10 Essential (primary) hypertension: Secondary | ICD-10-CM | POA: Diagnosis not present

## 2018-01-30 DIAGNOSIS — Z87891 Personal history of nicotine dependence: Secondary | ICD-10-CM | POA: Insufficient documentation

## 2018-01-30 DIAGNOSIS — E7800 Pure hypercholesterolemia, unspecified: Secondary | ICD-10-CM | POA: Diagnosis not present

## 2018-01-30 DIAGNOSIS — Z87442 Personal history of urinary calculi: Secondary | ICD-10-CM | POA: Diagnosis not present

## 2018-01-30 DIAGNOSIS — N132 Hydronephrosis with renal and ureteral calculous obstruction: Secondary | ICD-10-CM | POA: Diagnosis not present

## 2018-01-30 DIAGNOSIS — N201 Calculus of ureter: Secondary | ICD-10-CM

## 2018-01-30 DIAGNOSIS — N200 Calculus of kidney: Secondary | ICD-10-CM

## 2018-01-30 HISTORY — PX: CYSTOSCOPY/URETEROSCOPY/HOLMIUM LASER/STENT PLACEMENT: SHX6546

## 2018-01-30 SURGERY — CYSTOSCOPY/URETEROSCOPY/HOLMIUM LASER/STENT PLACEMENT
Anesthesia: General | Site: Ureter | Laterality: Right | Wound class: Clean Contaminated

## 2018-01-30 MED ORDER — PROPOFOL 500 MG/50ML IV EMUL
INTRAVENOUS | Status: AC
  Filled 2018-01-30: qty 50

## 2018-01-30 MED ORDER — FENTANYL CITRATE (PF) 100 MCG/2ML IJ SOLN
INTRAMUSCULAR | Status: DC | PRN
  Administered 2018-01-30: 100 ug via INTRAVENOUS

## 2018-01-30 MED ORDER — DEXMEDETOMIDINE HCL IN NACL 200 MCG/50ML IV SOLN
INTRAVENOUS | Status: DC | PRN
  Administered 2018-01-30 (×2): 10 ug via INTRAVENOUS

## 2018-01-30 MED ORDER — CEFAZOLIN SODIUM-DEXTROSE 2-3 GM-%(50ML) IV SOLR
INTRAVENOUS | Status: AC
  Filled 2018-01-30: qty 50

## 2018-01-30 MED ORDER — FENTANYL CITRATE (PF) 100 MCG/2ML IJ SOLN
INTRAMUSCULAR | Status: AC
  Filled 2018-01-30: qty 2

## 2018-01-30 MED ORDER — IOTHALAMATE MEGLUMINE 43 % IV SOLN
INTRAVENOUS | Status: DC | PRN
  Administered 2018-01-30: 25 mL

## 2018-01-30 MED ORDER — GLYCOPYRROLATE 0.2 MG/ML IJ SOLN
INTRAMUSCULAR | Status: AC
  Filled 2018-01-30: qty 3

## 2018-01-30 MED ORDER — FAMOTIDINE 20 MG PO TABS
ORAL_TABLET | ORAL | Status: AC
  Filled 2018-01-30: qty 1

## 2018-01-30 MED ORDER — LACTATED RINGERS IV SOLN
INTRAVENOUS | Status: DC
  Administered 2018-01-30: 10:00:00 via INTRAVENOUS

## 2018-01-30 MED ORDER — FAMOTIDINE 20 MG PO TABS
20.0000 mg | ORAL_TABLET | Freq: Once | ORAL | Status: AC
  Administered 2018-01-30: 20 mg via ORAL

## 2018-01-30 MED ORDER — LIDOCAINE HCL (CARDIAC) PF 100 MG/5ML IV SOSY
PREFILLED_SYRINGE | INTRAVENOUS | Status: DC | PRN
  Administered 2018-01-30: 100 mg via INTRAVENOUS

## 2018-01-30 MED ORDER — ONDANSETRON HCL 4 MG/2ML IJ SOLN: 4.0000 mg | Freq: Once | INTRAMUSCULAR | Status: DC | PRN

## 2018-01-30 MED ORDER — ONDANSETRON HCL 4 MG/2ML IJ SOLN
INTRAMUSCULAR | Status: AC
  Filled 2018-01-30: qty 2

## 2018-01-30 MED ORDER — EPHEDRINE SULFATE 50 MG/ML IJ SOLN
INTRAMUSCULAR | Status: DC | PRN
  Administered 2018-01-30 (×2): 5 mg via INTRAVENOUS

## 2018-01-30 MED ORDER — FENTANYL CITRATE (PF) 100 MCG/2ML IJ SOLN: 25.0000 ug | INTRAMUSCULAR | Status: DC | PRN

## 2018-01-30 MED ORDER — ONDANSETRON HCL 4 MG/2ML IJ SOLN
INTRAMUSCULAR | Status: DC | PRN
  Administered 2018-01-30: 4 mg via INTRAVENOUS

## 2018-01-30 MED ORDER — PROPOFOL 10 MG/ML IV BOLUS
INTRAVENOUS | Status: DC | PRN
  Administered 2018-01-30: 200 mg via INTRAVENOUS
  Administered 2018-01-30: 50 mg via INTRAVENOUS

## 2018-01-30 MED ORDER — HYDROCODONE-ACETAMINOPHEN 5-325 MG PO TABS: 1.0000 | ORAL_TABLET | ORAL | 0 refills | Status: AC | PRN

## 2018-01-30 MED ORDER — OXYBUTYNIN CHLORIDE 5 MG PO TABS: ORAL_TABLET | ORAL | 1 refills | Status: AC

## 2018-01-30 MED ORDER — LIDOCAINE HCL (PF) 2 % IJ SOLN
INTRAMUSCULAR | Status: AC
  Filled 2018-01-30: qty 10

## 2018-01-30 MED ORDER — GLYCOPYRROLATE 0.2 MG/ML IJ SOLN
INTRAMUSCULAR | Status: DC | PRN
  Administered 2018-01-30 (×2): 0.2 mg via INTRAVENOUS

## 2018-01-30 SURGICAL SUPPLY — 29 items
BAG DRAIN CYSTO-URO LG1000N (MISCELLANEOUS) ×2 IMPLANT
BASKET ZERO TIP 1.9FR (BASKET) ×2 IMPLANT
BRUSH SCRUB EZ 1% IODOPHOR (MISCELLANEOUS) ×2 IMPLANT
CATH SET URETHRAL DILATOR (CATHETERS) ×2 IMPLANT
CATH URETL 5X70 OPEN END (CATHETERS) ×2 IMPLANT
CNTNR SPEC 2.5X3XGRAD LEK (MISCELLANEOUS)
CONRAY 43 FOR UROLOGY 50M (MISCELLANEOUS) ×2 IMPLANT
CONT SPEC 4OZ STER OR WHT (MISCELLANEOUS)
CONTAINER SPEC 2.5X3XGRAD LEK (MISCELLANEOUS) IMPLANT
DRAPE UTILITY 15X26 TOWEL STRL (DRAPES) ×2 IMPLANT
FIBER LASER LITHO 273 (Laser) ×2 IMPLANT
GLOVE BIO SURGEON STRL SZ8 (GLOVE) ×2 IMPLANT
GOWN STRL REUS W/ TWL LRG LVL3 (GOWN DISPOSABLE) ×2 IMPLANT
GOWN STRL REUS W/TWL LRG LVL3 (GOWN DISPOSABLE) ×2
GUIDEWIRE GREEN .038 145CM (MISCELLANEOUS) IMPLANT
INFUSOR MANOMETER BAG 3000ML (MISCELLANEOUS) ×2 IMPLANT
INTRODUCER DILATOR DOUBLE (INTRODUCER) IMPLANT
KIT TURNOVER CYSTO (KITS) ×2 IMPLANT
PACK CYSTO AR (MISCELLANEOUS) ×2 IMPLANT
SENSORWIRE 0.038 NOT ANGLED (WIRE) ×4
SET CYSTO W/LG BORE CLAMP LF (SET/KITS/TRAYS/PACK) ×2 IMPLANT
SHEATH URETERAL 12FRX35CM (MISCELLANEOUS) IMPLANT
SOL .9 NS 3000ML IRR  AL (IV SOLUTION) ×1
SOL .9 NS 3000ML IRR UROMATIC (IV SOLUTION) ×1 IMPLANT
STENT URET 6FRX24 CONTOUR (STENTS) ×2 IMPLANT
STENT URET 6FRX26 CONTOUR (STENTS) IMPLANT
SURGILUBE 2OZ TUBE FLIPTOP (MISCELLANEOUS) ×2 IMPLANT
WATER STERILE IRR 1000ML POUR (IV SOLUTION) ×2 IMPLANT
WIRE SENSOR 0.038 NOT ANGLED (WIRE) ×2 IMPLANT

## 2018-01-30 NOTE — Anesthesia Post-op Follow-up Note (Signed)
Anesthesia QCDR form completed.        

## 2018-01-30 NOTE — OR Nursing (Signed)
Scant bloody drng on washcloth when gretting dressed.  To BR for second void - bloody urinne, slow drippage from urethra, washcloth/peri pad changed to under pants.  Discharged to home, advises he will call MD if constant bloody drng to pad while sitting.

## 2018-01-30 NOTE — Discharge Instructions (Signed)

## 2018-01-30 NOTE — Transfer of Care (Signed)
Immediate Anesthesia Transfer of Care Note  Patient: Larry Webb  Procedure(s) Performed: CYSTOSCOPY/URETEROSCOPY/HOLMIUM LASER/STENT PLACEMENT (Right Ureter)  Patient Location: PACU  Anesthesia Type:General  Level of Consciousness: awake, alert , oriented and patient cooperative  Airway & Oxygen Therapy: Patient Spontanous Breathing  Post-op Assessment: Report given to RN, Post -op Vital signs reviewed and stable and Patient moving all extremities X 4  Post vital signs: Reviewed and stable  Last Vitals:  Vitals Value Taken Time  BP 145/74 01/30/2018 12:12 PM  Temp 36.1 C 01/30/2018 12:12 PM  Pulse 77 01/30/2018 12:12 PM  Resp 19 01/30/2018 12:12 PM  SpO2 97 % 01/30/2018 12:12 PM    Last Pain:  Vitals:   01/30/18 0935  TempSrc: Oral         Complications: No apparent anesthesia complications

## 2018-01-30 NOTE — Anesthesia Preprocedure Evaluation (Signed)
Anesthesia Evaluation  Patient identified by MRN, date of birth, ID band Patient awake    Reviewed: Allergy & Precautions, H&P , NPO status , Patient's Chart, lab work & pertinent test results, reviewed documented beta blocker date and time   History of Anesthesia Complications Negative for: history of anesthetic complications  Airway Mallampati: II  TM Distance: >3 FB Neck ROM: full    Dental  (+) Caps, Dental Advidsory Given, Teeth Intact   Pulmonary neg pulmonary ROS, former smoker,           Cardiovascular Exercise Tolerance: Good hypertension, (-) angina(-) CAD, (-) Past MI, (-) Cardiac Stents and (-) CABG (-) dysrhythmias (-) Valvular Problems/Murmurs     Neuro/Psych negative neurological ROS  negative psych ROS   GI/Hepatic negative GI ROS, Neg liver ROS,   Endo/Other  negative endocrine ROS  Renal/GU negative Renal ROS  negative genitourinary   Musculoskeletal   Abdominal   Peds  Hematology negative hematology ROS (+)   Anesthesia Other Findings Past Medical History: No date: High cholesterol No date: History of kidney stones No date: History of shingles     Comment:  right eye No date: Hyperlipidemia No date: Hypertension   Reproductive/Obstetrics negative OB ROS                             Anesthesia Physical Anesthesia Plan  ASA: II  Anesthesia Plan: General   Post-op Pain Management:    Induction: Intravenous  PONV Risk Score and Plan: 2 and Ondansetron and Dexamethasone  Airway Management Planned: LMA and Oral ETT  Additional Equipment:   Intra-op Plan:   Post-operative Plan: Extubation in OR  Informed Consent: I have reviewed the patients History and Physical, chart, labs and discussed the procedure including the risks, benefits and alternatives for the proposed anesthesia with the patient or authorized representative who has indicated his/her understanding  and acceptance.   Dental Advisory Given  Plan Discussed with: Anesthesiologist, CRNA and Surgeon  Anesthesia Plan Comments:         Anesthesia Quick Evaluation

## 2018-01-30 NOTE — Op Note (Signed)
Preoperative diagnosis: Right ureteral calculus  Postoperative diagnosis: Right mid ureteral calculus  Procedure:  1. Cystoscopy 2. Right ureteroscopy and stone removal 3. Ureteroscopic laser lithotripsy 4. Right ureteral stent placement (6 FR) 24 centimeter 5. Right retrograde pyelography with interpretation  Surgeon: Nicki Reaper C. Stoioff, M.D.  Anesthesia: General  Complications: None  Intraoperative findings:  1.  Right retrograde pyelography post procedure showed no filling defects, stone fragments or contrast extravasation  EBL: Minimal  Specimens: 1. Calculus fragments for analysis   Indication: Larry Webb is a 57 y.o. year old patient initially seen on 01/12/2018 after a CT performed in late March showed a 6 mm calculus in the lower portion of the right proximal ureter with moderate hydronephrosis/hydroureter.  He was initially interested in shockwave lithotripsy however the stone could not be visualized on plain radiography.  He continues with intermittent symptoms and elected ureteroscopic removal.   After reviewing the management options for treatment, the patient elected to proceed with the above surgical procedure(s). We have discussed the potential benefits and risks of the procedure, side effects of the proposed treatment, the likelihood of the patient achieving the goals of the procedure, and any potential problems that might occur during the procedure or recuperation. Informed consent has been obtained.  Description of procedure:  The patient was taken to the operating room and general anesthesia was induced.  The patient was placed in the dorsal lithotomy position, prepped and draped in the usual sterile fashion, and preoperative antibiotics were administered. A preoperative time-out was performed.   A 22 French cystoscope was lubricated and passed under direct vision.  The urethra was normal in caliber several wide caliber bulbar urethral strictures noted.  The 33  French cystoscope would not traverse the most proximal stricture and a 0.038 guidewire was placed in the cystoscope, through the stricture and advanced into the bladder.  The stricture was then dilated with over the wire and dilators from 12-16 [place].  The cystoscope was then repassed without difficulty.  The prostate demonstrated mild lateral lobe enlargement and mild to moderate bladder neck bladder neck elevation.  Panendoscopy was performed and the bladder mucosa showed no erythema, solid or papillary lesions.  Attention was directed to the right ureteral orifice and a 0.038 Sensor wire was then advanced up the right ureter into the renal pelvis under fluoroscopic guidance.  Mild resistance to the wire was noted in the right mid ureter.  A 6 Fr semirigid ureteroscope was then advanced into the ureter next to the guidewire with the ureter being rather tight to the semirigid scope.  In the mid ureter the calculus was identified.  Fairly significant inflammatory changes were noted in the ureteral mucosa distal to the stone  The stone was then fragmented with the 273 micron holmium laser fiber on a setting of 0.8J and frequency of 8 Hz.   All stone fragments were then removed from the ureter with a zero tip nitinol basket.  Reinspection of the ureter revealed no remaining visible stones or fragments.   Retrograde pyelogram was performed with findings as described above.  Due to the inflammatory changes and tightness of the right ureter it was elected not to place an access sheath and attempt to remove the nonobstructing renal calculi   The wire was then backloaded through the cystoscope and a ureteral stent was advance over the wire using Seldinger technique. A 6 FR/ 24 cm stent was was placed under fluoroscopic guidance.  The wire was then removed with an  adequate stent curl noted in the renal pelvis as well as in the bladder.  The bladder was then emptied and the procedure ended.  The patient  appeared to tolerate the procedure well and without complications.  After anesthetic reversal the patient was transported to the PACU in stable condition.   He will be scheduled for stent removal in approximately 2 weeks.

## 2018-01-30 NOTE — Anesthesia Procedure Notes (Signed)
Procedure Name: LMA Insertion Performed by: Patience Musca., CRNA Pre-anesthesia Checklist: Patient identified, Patient being monitored, Timeout performed, Emergency Drugs available and Suction available Patient Re-evaluated:Patient Re-evaluated prior to induction Oxygen Delivery Method: Circle system utilized Preoxygenation: Pre-oxygenation with 100% oxygen Induction Type: IV induction Ventilation: Mask ventilation without difficulty LMA: LMA inserted LMA Size: 5.0 Tube type: Oral Number of attempts: 1 Placement Confirmation: positive ETCO2 and breath sounds checked- equal and bilateral Tube secured with: Tape Dental Injury: Teeth and Oropharynx as per pre-operative assessment

## 2018-01-30 NOTE — Interval H&P Note (Signed)
History and Physical Interval Note:  01/30/2018 10:50 AM  Larry Webb  has presented today for surgery, with the diagnosis of right ureteral calculus, right nephrolithiasis  The various methods of treatment have been discussed with the patient and family. After consideration of risks, benefits and other options for treatment, the patient has consented to  Procedure(s): CYSTOSCOPY/URETEROSCOPY/HOLMIUM LASER/STENT PLACEMENT (Right) as a surgical intervention .  The patient's history has been reviewed, patient examined, no change in status, stable for surgery.  I have reviewed the patient's chart and labs.  Questions were answered to the patient's satisfaction.     [place]

## 2018-01-30 NOTE — OR Nursing (Signed)
Pt up to BR on arrival to PACU, bloody, constant bloody drippage from urethra after void, notified Dr. Bernardo Heater of same, applied washcloths around penis, peripads in hospital briefs.

## 2018-01-31 ENCOUNTER — Telehealth: Payer: Self-pay | Admitting: Urology

## 2018-01-31 ENCOUNTER — Encounter: Payer: Self-pay | Admitting: Urology

## 2018-01-31 NOTE — Telephone Encounter (Signed)
-----   Message from Abbie Sons, MD sent at 01/31/2018 12:53 PM EDT ----- Schedule cystoscopy with stent removal in approximately 2 weeks

## 2018-01-31 NOTE — Telephone Encounter (Signed)
The only place I could find to add him was on 02-16-18 @ the end of clinic.   Patient is aware  Sharyn Lull

## 2018-02-01 ENCOUNTER — Telehealth: Payer: Self-pay

## 2018-02-01 NOTE — Telephone Encounter (Signed)
Pt called stating he had surgery on Tuesday and is still seeing a lot of blood. Noted stent placement in surgery records. Reinforced with pt bleeding post surgery is common. Reinforced with pt to increase fluid in take and rest as much as possible. Pt denied n/v, f/c, dysuria, or pain. Pt voiced understanding of whole conversation.

## 2018-02-01 NOTE — Telephone Encounter (Signed)
Is it possible to do on 11/9 at 1130?  It is unlikely I will have that many lithotripsies that would extend me to lunchtime.

## 2018-02-01 NOTE — Telephone Encounter (Signed)
I have moved the appointment to the 9th @ 11:30  Thanks,  Sharyn Lull

## 2018-02-03 NOTE — Anesthesia Postprocedure Evaluation (Signed)
Anesthesia Post Note  Patient: Larry Webb  Procedure(s) Performed: CYSTOSCOPY/URETEROSCOPY/HOLMIUM LASER/STENT PLACEMENT (Right Ureter)  Patient location during evaluation: PACU Anesthesia Type: General Level of consciousness: awake and alert Pain management: pain level controlled Vital Signs Assessment: post-procedure vital signs reviewed and stable Respiratory status: spontaneous breathing, nonlabored ventilation, respiratory function stable and patient connected to nasal cannula oxygen Cardiovascular status: blood pressure returned to baseline and stable Postop Assessment: no apparent nausea or vomiting Anesthetic complications: no     Last Vitals:  Vitals:   01/30/18 1259 01/30/18 1335  BP: (!) 143/78 139/64  Pulse: 64 63  Resp: 16 16  Temp: (!) 36.3 C (!) 36.4 C  SpO2: 99% 97%    Last Pain:  Vitals:   02/02/18 0815  TempSrc:   PainSc: 0-No pain                 Martha Clan

## 2018-02-05 LAB — STONE ANALYSIS
CA OXALATE, MONOHYDR.: 90 %
CA PHOS CRY STONE QL IR: 10 %
Stone Weight KSTONE: 71 mg

## 2018-02-07 ENCOUNTER — Ambulatory Visit (INDEPENDENT_AMBULATORY_CARE_PROVIDER_SITE_OTHER): Payer: Managed Care, Other (non HMO)

## 2018-02-07 VITALS — BP 144/68 | HR 54

## 2018-02-07 DIAGNOSIS — R350 Frequency of micturition: Secondary | ICD-10-CM | POA: Diagnosis not present

## 2018-02-07 LAB — URINALYSIS, COMPLETE
Bilirubin, UA: NEGATIVE
Glucose, UA: NEGATIVE
Ketones, UA: NEGATIVE
Nitrite, UA: NEGATIVE
PH UA: 6 (ref 5.0–7.5)
Specific Gravity, UA: 1.02 (ref 1.005–1.030)
Urobilinogen, Ur: 0.2 mg/dL (ref 0.2–1.0)

## 2018-02-07 LAB — MICROSCOPIC EXAMINATION: RBC, UA: >30 /hpf — ABNORMAL HIGH (ref 0–2)

## 2018-02-07 NOTE — Progress Notes (Signed)
Patient present today complaining of urinary frequency and gross hematuria. Patient is post op with stent in place, and he was reassured that this can happen with a stent.   UA today was sent for culture to rule out infection. Per Dr. Bernardo Heater patient was given samples of Myrbetriq 50mg  to help with spasm pain and discomfort. Patient was instructed to not take oxybutinin with the Myrbetriq and keep follow up next week for stent removal

## 2018-02-07 NOTE — Telephone Encounter (Signed)
Patient called the office again today with compliant of stent pain, stinging during urination, and bleeding (bright red blood spots in his underwear).    His stent removal is scheduled for 02/15/2018.  Patient states that he cannot wait that long to have it removed.  Please advise.  Patient can be reached by cell at [phone].

## 2018-02-10 LAB — CULTURE, URINE COMPREHENSIVE

## 2018-02-12 ENCOUNTER — Telehealth: Payer: Self-pay

## 2018-02-12 NOTE — Telephone Encounter (Signed)
-----   Message from Abbie Sons, MD sent at 02/11/2018  5:38 PM EDT ----- Urine cx was neg

## 2018-02-12 NOTE — Telephone Encounter (Signed)
Pt informed

## 2018-02-15 ENCOUNTER — Ambulatory Visit (INDEPENDENT_AMBULATORY_CARE_PROVIDER_SITE_OTHER): Payer: Managed Care, Other (non HMO) | Admitting: Urology

## 2018-02-15 ENCOUNTER — Encounter: Payer: Self-pay | Admitting: Urology

## 2018-02-15 VITALS — BP 161/84 | HR 68 | Resp 16 | Ht 64.0 in | Wt 230.4 lb

## 2018-02-15 DIAGNOSIS — R350 Frequency of micturition: Secondary | ICD-10-CM

## 2018-02-15 DIAGNOSIS — N132 Hydronephrosis with renal and ureteral calculous obstruction: Secondary | ICD-10-CM

## 2018-02-15 DIAGNOSIS — N200 Calculus of kidney: Secondary | ICD-10-CM

## 2018-02-15 DIAGNOSIS — N201 Calculus of ureter: Secondary | ICD-10-CM

## 2018-02-15 LAB — URINALYSIS, COMPLETE
Bilirubin, UA: NEGATIVE
Glucose, UA: NEGATIVE
KETONES UA: NEGATIVE
Nitrite, UA: NEGATIVE
Urobilinogen, Ur: 0.2 mg/dL (ref 0.2–1.0)
pH, UA: 6 (ref 5.0–7.5)

## 2018-02-15 LAB — MICROSCOPIC EXAMINATION: RBC, UA: >30 /hpf — ABNORMAL HIGH (ref 0–2)

## 2018-02-16 ENCOUNTER — Other Ambulatory Visit: Payer: Managed Care, Other (non HMO) | Admitting: Urology

## 2018-02-17 ENCOUNTER — Encounter: Payer: Self-pay | Admitting: Urology

## 2018-02-17 NOTE — Progress Notes (Signed)
Indications: Patient is 57 y.o., male who recently underwent ureteroscopic removal of a 6 mm right mid ureteral calculus on 01/30/2018 with remaining indwelling JJ ureteral stent.  The patient is presenting today for stent removal.  Procedure:  Flexible Cystoscopy with stent removal (79432)  Timeout was performed and the correct patient, procedure and participants were identified.    Description:  The patient was prepped and draped in the usual sterile fashion. Flexible cystosopy was performed.  The stent was visualized, grasped, and removed intact without difficulty. The patient tolerated the procedure well.  A single dose of oral antibiotics was given.  Complications:  None  Plan: He was instructed to call for development of right flank pain or renal colic.  Stone analysis was 90% calcium oxalate monohydrate/10% calcium phosphate. I recommended proceeding with a metabolic evaluation

## 2018-02-28 ENCOUNTER — Ambulatory Visit: Payer: Managed Care, Other (non HMO) | Admitting: Urology

## 2018-03-21 ENCOUNTER — Telehealth: Payer: Self-pay | Admitting: Urology

## 2018-03-21 NOTE — Telephone Encounter (Signed)
Pt states he received a friendly reminder text stating that physician is still waiting on your litholink results from your 24 hr collection. Pt states he never received the kit.  Please advise.  Thanks.

## 2018-03-21 NOTE — Telephone Encounter (Signed)
Spoke to [employer] and package was delivered. [employer] will resend the package today. Patient notified

## 2018-03-21 NOTE — Telephone Encounter (Signed)
Pt LMOM asking to have his [place] shipping addressed changed to another location. Please advise pt at [phone]. Thanks.

## 2018-03-29 ENCOUNTER — Telehealth: Payer: Self-pay | Admitting: Urology

## 2018-03-29 ENCOUNTER — Ambulatory Visit (INDEPENDENT_AMBULATORY_CARE_PROVIDER_SITE_OTHER): Payer: BLUE CROSS/BLUE SHIELD | Admitting: Family Medicine

## 2018-03-29 ENCOUNTER — Encounter: Payer: Self-pay | Admitting: Family Medicine

## 2018-03-29 VITALS — BP 149/83 | HR 51 | Ht 64.0 in | Wt 223.5 lb

## 2018-03-29 DIAGNOSIS — N200 Calculus of kidney: Secondary | ICD-10-CM

## 2018-03-29 LAB — URINALYSIS, COMPLETE
Bilirubin, UA: NEGATIVE
GLUCOSE, UA: NEGATIVE
Ketones, UA: NEGATIVE
Leukocytes, UA: NEGATIVE
NITRITE UA: NEGATIVE
Protein, UA: NEGATIVE
Specific Gravity, UA: 1.01 (ref 1.005–1.030)
UUROB: 0.2 mg/dL (ref 0.2–1.0)
pH, UA: 6 (ref 5.0–7.5)

## 2018-03-29 LAB — MICROSCOPIC EXAMINATION
BACTERIA UA: NONE SEEN
Epithelial Cells (non renal): NONE SEEN /hpf (ref 0–10)

## 2018-03-29 NOTE — Telephone Encounter (Signed)
Pt called and states he thought maybe he accidentally busted a pus pocket or cyst.  He had a brownish/reddish colored urine.  He said it eventually cleared up but wanted to know if this is an issue that should be addressed.  Please give pt a call at [phone].

## 2018-03-29 NOTE — Telephone Encounter (Signed)
Pt would like to speak to you about a popping sensation he felt weds morning while laying in bed on his side. Wants to discuss the reasons of this and be advised if he needs to be seen or not. Please advise pt. Thanks.

## 2018-03-29 NOTE — Telephone Encounter (Signed)
Spoke with patient and he was told to come to the office and provide a urine sample to rule out infection. Patient also states he is having residual flank pain.

## 2018-03-29 NOTE — Progress Notes (Signed)
Patient presents today with hematuria and urinary urgency. He states the symptoms began 2 days ago. He has not had ABX or Surgery in the last 30 days. He did have surgery on 01/30/2018. A renal Ultrasound was ordered along with a UA, UCX.

## 2018-03-29 NOTE — Telephone Encounter (Signed)
Patient was seen today on Nurse schedule for a Urine and urine culture.

## 2018-04-04 LAB — CULTURE, URINE COMPREHENSIVE

## 2018-04-05 ENCOUNTER — Ambulatory Visit
Admission: RE | Admit: 2018-04-05 | Discharge: 2018-04-05 | Disposition: A | Discharge status: Home / Self Care | Payer: BLUE CROSS/BLUE SHIELD | Source: Ambulatory Visit | Attending: Urology | Admitting: Urology

## 2018-04-05 ENCOUNTER — Telehealth: Payer: Self-pay | Admitting: Urology

## 2018-04-05 DIAGNOSIS — N281 Cyst of kidney, acquired: Secondary | ICD-10-CM | POA: Insufficient documentation

## 2018-04-05 DIAGNOSIS — N200 Calculus of kidney: Secondary | ICD-10-CM

## 2018-04-05 DIAGNOSIS — N1330 Unspecified hydronephrosis: Secondary | ICD-10-CM | POA: Insufficient documentation

## 2018-04-05 NOTE — Telephone Encounter (Signed)
Pt confirming he did get imaging done today, states that he has not heard from anyone about his urine results from last week, also question should he still be passing some brownish/rusty looking blood this far out from his surgery.  Pt concerned about what's going on and would like to speak to someone. Please advise pt at [phone]

## 2018-04-06 ENCOUNTER — Telehealth: Payer: Self-pay

## 2018-04-06 MED ORDER — AMOXICILLIN 875 MG PO TABS: 875.0000 mg | ORAL_TABLET | Freq: Two times a day (BID) | ORAL | 0 refills | Status: AC

## 2018-04-06 NOTE — Telephone Encounter (Signed)
-----   Message from Abbie Sons, MD sent at 04/05/2018 10:58 AM EDT ----- Urine culture showed a low level of bacteria.  If still symptomatic would recommend Rx amoxicillin 875 mg twice daily x7 days.

## 2018-04-06 NOTE — Telephone Encounter (Signed)
Pt informed, still symptomatic, abx sent to pharmacy.

## 2018-04-09 NOTE — Telephone Encounter (Signed)
Pt was informed and started on abx, see last note.

## 2018-04-10 ENCOUNTER — Telehealth: Payer: Self-pay

## 2018-04-10 NOTE — Telephone Encounter (Signed)
Patient notified

## 2018-04-10 NOTE — Telephone Encounter (Signed)
-----   Message from Abbie Sons, MD sent at 04/10/2018 11:41 AM EDT ----- Renal ultrasound showed nonobstructing calculi.  Will await 24 urine study.

## 2018-07-11 ENCOUNTER — Other Ambulatory Visit: Payer: Self-pay

## 2018-07-11 ENCOUNTER — Telehealth: Payer: Self-pay

## 2018-07-11 DIAGNOSIS — Z86010 Personal history of colonic polyps: Secondary | ICD-10-CM

## 2018-07-11 NOTE — Telephone Encounter (Signed)
Patient called back stated his insurance will cover his colonoscopy if he has it at [facility].  He has been rescheduled with Dr. Allen Norris for 07/31/18 at [facility].  Thanks [employer]

## 2018-07-11 NOTE — Telephone Encounter (Signed)
Patient contacted office to cancel colonoscopy because his insurance is not in network.  Thanks [employer]

## 2018-07-17 ENCOUNTER — Telehealth: Payer: Self-pay | Admitting: Urology

## 2018-07-17 NOTE — Telephone Encounter (Signed)
Patient called back and he has now passed 2 stones.  He does not think he will bring them by to send off for testing.  He has had that done in the past.

## 2018-07-17 NOTE — Telephone Encounter (Signed)
Patient passed another stone and wants to know if you want him to bring it in to have it sent off? He said he has not forgot about the 24hr UA he just has other things to do before he can get that done, but he is going to do it.  Please advise   Sharyn Lull

## 2018-07-22 NOTE — Telephone Encounter (Signed)
He had stones analyzed back in April and additional months do not need to be analyzed at this time.

## 2018-07-23 ENCOUNTER — Ambulatory Visit: Admit: 2018-07-23 | Payer: BLUE CROSS/BLUE SHIELD | Admitting: Gastroenterology

## 2018-07-23 SURGERY — COLONOSCOPY WITH PROPOFOL
Anesthesia: General

## 2018-07-30 ENCOUNTER — Encounter: Payer: Self-pay | Admitting: *Deleted

## 2018-07-31 ENCOUNTER — Ambulatory Visit: Payer: BLUE CROSS/BLUE SHIELD | Admitting: Anesthesiology

## 2018-07-31 ENCOUNTER — Encounter: Payer: Self-pay | Admitting: Emergency Medicine

## 2018-07-31 ENCOUNTER — Encounter
Admission: RE | Disposition: A | Discharge status: Home / Self Care | Payer: Self-pay | Source: Ambulatory Visit | Attending: Gastroenterology

## 2018-07-31 ENCOUNTER — Ambulatory Visit
Admission: RE | Admit: 2018-07-31 | Discharge: 2018-07-31 | Disposition: A | Discharge status: Home / Self Care | Payer: BLUE CROSS/BLUE SHIELD | Source: Ambulatory Visit | Attending: Gastroenterology | Admitting: Gastroenterology

## 2018-07-31 DIAGNOSIS — E7800 Pure hypercholesterolemia, unspecified: Secondary | ICD-10-CM | POA: Diagnosis not present

## 2018-07-31 DIAGNOSIS — I10 Essential (primary) hypertension: Secondary | ICD-10-CM | POA: Insufficient documentation

## 2018-07-31 DIAGNOSIS — Z7982 Long term (current) use of aspirin: Secondary | ICD-10-CM | POA: Insufficient documentation

## 2018-07-31 DIAGNOSIS — Z87891 Personal history of nicotine dependence: Secondary | ICD-10-CM | POA: Insufficient documentation

## 2018-07-31 DIAGNOSIS — Z87442 Personal history of urinary calculi: Secondary | ICD-10-CM | POA: Insufficient documentation

## 2018-07-31 DIAGNOSIS — Z860100 Personal history of colon polyps, unspecified: Secondary | ICD-10-CM

## 2018-07-31 DIAGNOSIS — Z79899 Other long term (current) drug therapy: Secondary | ICD-10-CM | POA: Insufficient documentation

## 2018-07-31 DIAGNOSIS — D124 Benign neoplasm of descending colon: Secondary | ICD-10-CM | POA: Diagnosis not present

## 2018-07-31 DIAGNOSIS — Z1211 Encounter for screening for malignant neoplasm of colon: Secondary | ICD-10-CM | POA: Insufficient documentation

## 2018-07-31 DIAGNOSIS — D120 Benign neoplasm of cecum: Secondary | ICD-10-CM | POA: Diagnosis not present

## 2018-07-31 HISTORY — PX: COLONOSCOPY WITH PROPOFOL: SHX5780

## 2018-07-31 SURGERY — COLONOSCOPY WITH PROPOFOL
Anesthesia: General

## 2018-07-31 MED ORDER — LIDOCAINE HCL (PF) 2 % IJ SOLN
INTRAMUSCULAR | Status: AC
  Filled 2018-07-31: qty 10

## 2018-07-31 MED ORDER — PROPOFOL 500 MG/50ML IV EMUL
INTRAVENOUS | Status: DC | PRN
  Administered 2018-07-31: 150 ug/kg/min via INTRAVENOUS

## 2018-07-31 MED ORDER — SODIUM CHLORIDE 0.9 % IV SOLN
INTRAVENOUS | Status: DC
  Administered 2018-07-31: 1000 mL via INTRAVENOUS

## 2018-07-31 MED ORDER — LIDOCAINE HCL (CARDIAC) PF 100 MG/5ML IV SOSY
PREFILLED_SYRINGE | INTRAVENOUS | Status: DC | PRN
  Administered 2018-07-31: 100 mg via INTRAVENOUS

## 2018-07-31 MED ORDER — PROPOFOL 10 MG/ML IV BOLUS
INTRAVENOUS | Status: DC | PRN
  Administered 2018-07-31 (×2): 100 mg via INTRAVENOUS

## 2018-07-31 NOTE — OR Nursing (Signed)
Right AC swollen from infiltrated IV, no redness,  warm blanket applied.

## 2018-07-31 NOTE — Anesthesia Postprocedure Evaluation (Signed)
Anesthesia Post Note  Patient: DATRELL DUNTON  Procedure(s) Performed: COLONOSCOPY WITH PROPOFOL (N/A )  Patient location during evaluation: Endoscopy Anesthesia Type: General Level of consciousness: awake and alert Pain management: pain level controlled Vital Signs Assessment: post-procedure vital signs reviewed and stable Respiratory status: spontaneous breathing, nonlabored ventilation, respiratory function stable and patient connected to nasal cannula oxygen Cardiovascular status: blood pressure returned to baseline and stable Postop Assessment: no apparent nausea or vomiting Anesthetic complications: no     Last Vitals:  Vitals:   07/31/18 0952 07/31/18 1002  BP: 112/64 (!) 125/100  Pulse: (!) 54 61  Resp: 16 (!) 22  Temp: (!) 36.1 C   SpO2: 100% 97%    Last Pain:  Vitals:   07/31/18 1012  TempSrc:   PainSc: 0-No pain                 Martha Clan

## 2018-07-31 NOTE — Transfer of Care (Signed)
Immediate Anesthesia Transfer of Care Note  Patient: RIDER ERMIS  Procedure(s) Performed: COLONOSCOPY WITH PROPOFOL (N/A )  Patient Location: Endoscopy Unit  Anesthesia Type:General  Level of Consciousness: sedated  Airway & Oxygen Therapy: Patient Spontanous Breathing and Patient connected to nasal cannula oxygen  Post-op Assessment: Report given to RN and Post -op Vital signs reviewed and stable  Post vital signs: Reviewed and stable  Last Vitals:  Vitals Value Taken Time  BP    Temp    Pulse    Resp    SpO2      Last Pain:  Vitals:   07/31/18 0813  TempSrc: Tympanic         Complications: No apparent anesthesia complications

## 2018-07-31 NOTE — H&P (Signed)
Lucilla Lame, MD [facility] [address] [place],  [address] Phone:[phone] Fax : [phone]  Primary Care Physician:  Adin Hector, MD Primary Gastroenterologist:  Dr. Allen Norris  Pre-Procedure History & Physical: HPI:  Larry Webb is a 57 y.o. male is here for an colonoscopy.   Past Medical History:  Diagnosis Date  . High cholesterol   . History of kidney stones   . History of shingles    right eye  . Hyperlipidemia   . Hypertension     Past Surgical History:  Procedure Laterality Date  . COLONOSCOPY  2014  . CYSTOSCOPY/URETEROSCOPY/HOLMIUM LASER/STENT PLACEMENT Right 01/30/2018   Procedure: CYSTOSCOPY/URETEROSCOPY/HOLMIUM LASER/STENT PLACEMENT;  Surgeon: Abbie Sons, MD;  Location: [facility] ORS;  Service: Urology;  Laterality: Right;  . HERNIA REPAIR     right inguinal    Prior to Admission medications   Medication Sig Start Date End Date Taking? Authorizing Provider  Aspirin-Caffeine (BC FAST PAIN RELIEF PO) Take 1 packet by mouth 2 (two) times daily as needed (for pain.).   Yes [provider]  Cholecalciferol (VITAMIN D3) 2000 units TABS Take 2,000 Units by mouth 2 (two) times daily.    Yes [provider]  HYDROcodone-acetaminophen (NORCO/VICODIN) 5-325 MG tablet Take 1 tablet by mouth every 4 (four) hours as needed for moderate pain. 01/30/18  Yes Stoioff, Ronda Fairly, MD  Multiple Vitamin (MULTIVITAMIN WITH MINERALS) TABS tablet Take 1 tablet by mouth at bedtime.   Yes [provider]  naproxen sodium (ALEVE) 220 MG tablet Take 220-440 mg by mouth 2 (two) times daily as needed (for pain.).   Yes [provider]  prednisoLONE Sodium Phosphate (PREDNISOL OP) Place 1 drop into the right eye every other day. In the night   Yes [provider]  Probiotic Product (PROBIOTIC-10 PO) Take 1 capsule by mouth at bedtime.    Yes [provider]  simvastatin (ZOCOR) 40 MG tablet Take 1 tablet (40 mg total) by  mouth daily at 6 PM. Patient taking differently: Take 40 mg by mouth daily.  04/10/15  Yes Roselee Nova, MD  valACYclovir (VALTREX) 500 MG tablet Take 500 mg by mouth 2 (two) times daily.   Yes [provider]  nebivolol (BYSTOLIC) 10 MG tablet Take 1 tablet (10 mg total) by mouth daily. Patient not taking: Reported on 07/31/2018 04/10/15   Roselee Nova, MD  oxybutynin (DITROPAN) 5 MG tablet 1 tab tid prn frequency,urgency, bladder spasm 01/30/18   Stoioff, Ronda Fairly, MD  [facility] Wort (V-R [facility] WORT) 300 MG TABS Take 300 mg by mouth daily.    [provider]    Allergies as of 07/11/2018  . (No Known Allergies)    Family History  Problem Relation Age of Onset  . Vision loss Mother   . Hearing loss Mother   . Pneumonia Father     Social History   Socioeconomic History  . Marital status: Single    Spouse name: Not on file  . Number of children: Not on file  . Years of education: Not on file  . Highest education level: Not on file  Occupational History  . Not on file  Social Needs  . Financial resource strain: Not on file  . Food insecurity:    Worry: Not on file    Inability: Not on file  . Transportation needs:    Medical: Not on file    Non-medical: Not on file  Tobacco Use  . Smoking status: Former Research scientist (life sciences)  . Smokeless tobacco: Never Used  Substance and Sexual Activity  . Alcohol use: Yes    Alcohol/week: 1.0 standard drinks    Types: 1 Shots of liquor per week  . Drug use: No  . Sexual activity: Not on file  Lifestyle  . Physical activity:    Days per week: Not on file    Minutes per session: Not on file  . Stress: Not on file  Relationships  . Social connections:    Talks on phone: Not on file    Gets together: Not on file    Attends religious service: Not on file    Active member of club or organization: Not on file    Attends meetings of clubs or organizations: Not on file    Relationship status: Not on file  . Intimate partner  violence:    Fear of current or ex partner: Not on file    Emotionally abused: Not on file    Physically abused: Not on file    Forced sexual activity: Not on file  Other Topics Concern  . Not on file  Social History Narrative  . Not on file    Review of Systems: See HPI, otherwise negative ROS  Physical Exam: BP (!) 146/99   Pulse (!) 55   Temp 99.3 F (37.4 C) (Tympanic)   Resp 16   Ht 5\' 4"  (1.626 m)   Wt 99.8 kg   SpO2 94%   BMI 37.76 kg/m  General:   Alert,  pleasant and cooperative in NAD Head:  Normocephalic and atraumatic. Neck:  Supple; no masses or thyromegaly. Lungs:  Clear throughout to auscultation.    Heart:  Regular rate and rhythm. Abdomen:  Soft, nontender and nondistended. Normal bowel sounds, without guarding, and without rebound.   Neurologic:  Alert and  oriented x4;  grossly normal neurologically.  Impression/Plan: Larry Webb is here for an colonoscopy to be performed for history of colon polyps  Risks, benefits, limitations, and alternatives regarding  colonoscopy have been reviewed with the patient.  Questions have been answered.  All parties agreeable.   Lucilla Lame, MD  07/31/2018, 9:04 AM

## 2018-07-31 NOTE — OR Nursing (Signed)
Compared right and left arms, swelling in right AC IV site much improved, no redness, pt denies pain.  Pt requesting to go home.

## 2018-07-31 NOTE — Brief Op Note (Signed)
IV site RAC swollen after propofol started. Discontinued and removed immediately. New IV started by Rosey Bath, MD using ultrasound in LFA. By end of case, swelling was decreased to minimal.  Infiltration reported to Recovery RN.

## 2018-07-31 NOTE — Anesthesia Preprocedure Evaluation (Signed)
Anesthesia Evaluation  Patient identified by MRN, date of birth, ID band Patient awake    Reviewed: Allergy & Precautions, H&P , NPO status , Patient's Chart, lab work & pertinent test results, reviewed documented beta blocker date and time   History of Anesthesia Complications Negative for: history of anesthetic complications  Airway Mallampati: II  TM Distance: >3 FB Neck ROM: full    Dental  (+) Caps, Dental Advidsory Given, Teeth Intact   Pulmonary neg pulmonary ROS, former smoker,           Cardiovascular Exercise Tolerance: Good hypertension, (-) angina(-) CAD, (-) Past MI, (-) Cardiac Stents and (-) CABG (-) dysrhythmias (-) Valvular Problems/Murmurs     Neuro/Psych negative neurological ROS  negative psych ROS   GI/Hepatic negative GI ROS, Neg liver ROS,   Endo/Other  negative endocrine ROS  Renal/GU negative Renal ROS  negative genitourinary   Musculoskeletal   Abdominal   Peds  Hematology negative hematology ROS (+)   Anesthesia Other Findings Past Medical History: No date: High cholesterol No date: History of kidney stones No date: History of shingles     Comment:  right eye No date: Hyperlipidemia No date: Hypertension   Reproductive/Obstetrics negative OB ROS                             Anesthesia Physical  Anesthesia Plan  ASA: II  Anesthesia Plan: General   Post-op Pain Management:    Induction: Intravenous  PONV Risk Score and Plan: 2 and Propofol infusion and TIVA  Airway Management Planned: Nasal Cannula and Natural Airway  Additional Equipment:   Intra-op Plan:   Post-operative Plan:   Informed Consent: I have reviewed the patients History and Physical, chart, labs and discussed the procedure including the risks, benefits and alternatives for the proposed anesthesia with the patient or authorized representative who has indicated his/her understanding  and acceptance.   Dental Advisory Given  Plan Discussed with: Anesthesiologist, CRNA and Surgeon  Anesthesia Plan Comments:         Anesthesia Quick Evaluation

## 2018-07-31 NOTE — Op Note (Signed)
[facility] Gastroenterology Patient Name: Larry Webb Procedure Date: 07/31/2018 9:03 AM MRN: [id number] Account #: [id number] Date of Birth: 1961/07/05 Admit Type: Outpatient Age: 57 Room: [facility] ENDO ROOM 4 Gender: Male Note Status: Finalized Procedure:            Colonoscopy Indications:          High risk colon cancer surveillance: Personal history                        of colonic polyps Providers:            Lucilla Lame MD, MD Referring MD:         Ramonita Lab, MD (Referring MD) Medicines:            Propofol per Anesthesia Complications:        No immediate complications. Procedure:            Pre-Anesthesia Assessment:                       - Prior to the procedure, a History and Physical was                        performed, and patient medications and allergies were                        reviewed. The patient's tolerance of previous                        anesthesia was also reviewed. The risks and benefits of                        the procedure and the sedation options and risks were                        discussed with the patient. All questions were                        answered, and informed consent was obtained. Prior                        Anticoagulants: The patient has taken no previous                        anticoagulant or antiplatelet agents. ASA Grade                        Assessment: II - A patient with mild systemic disease.                        After reviewing the risks and benefits, the patient was                        deemed in satisfactory condition to undergo the                        procedure.                       After obtaining informed consent, the colonoscope was  passed under direct vision. Throughout the procedure,                        the patient's blood pressure, pulse, and oxygen                        saturations were monitored continuously. The                        Colonoscope was  introduced through the anus and                        advanced to the the cecum, identified by appendiceal                        orifice and ileocecal valve. The colonoscopy was                        performed without difficulty. The patient tolerated the                        procedure well. The quality of the bowel preparation                        was excellent. Findings:      The perianal and digital rectal examinations were normal.      A 2 mm polyp was found in the cecum. The polyp was sessile. The polyp       was removed with a cold biopsy forceps. Resection and retrieval were       complete.      A 2 mm polyp was found in the descending colon. The polyp was sessile.       The polyp was removed with a cold biopsy forceps. Resection and       retrieval were complete. Impression:           - One 2 mm polyp in the cecum, removed with a cold                        biopsy forceps. Resected and retrieved.                       - One 2 mm polyp in the descending colon, removed with                        a cold biopsy forceps. Resected and retrieved. Recommendation:       - Discharge patient to home.                       - Resume previous diet.                       - Continue present medications.                       - Await pathology results.                       - Repeat colonoscopy in 5 years for surveillance. Procedure Code(s):    --- Professional ---  45380, Colonoscopy, flexible; with biopsy, single or                        multiple Diagnosis Code(s):    --- Professional ---                       Z86.010, Personal history of colonic polyps                       D12.0, Benign neoplasm of cecum                       D12.4, Benign neoplasm of descending colon CPT copyright 2018 American Medical Association. All rights reserved. The codes documented in this report are preliminary and upon coder review may  be revised to meet current compliance  requirements. Lucilla Lame MD, MD 07/31/2018 9:49:16 AM This report has been signed electronically. Number of Addenda: 0 Note Initiated On: 07/31/2018 9:03 AM Scope Withdrawal Time: 0 hours 6 minutes 54 seconds  Total Procedure Duration: 0 hours 8 minutes 6 seconds       [facility]

## 2018-07-31 NOTE — Anesthesia Post-op Follow-up Note (Signed)
Anesthesia QCDR form completed.        

## 2018-08-01 ENCOUNTER — Encounter: Payer: Self-pay | Admitting: Gastroenterology

## 2018-08-01 LAB — SURGICAL PATHOLOGY

## 2018-08-02 ENCOUNTER — Encounter: Payer: Self-pay | Admitting: Gastroenterology

## 2021-07-26 ENCOUNTER — Other Ambulatory Visit: Payer: Self-pay | Admitting: Internal Medicine

## 2021-07-26 ENCOUNTER — Other Ambulatory Visit (HOSPITAL_COMMUNITY): Payer: Self-pay | Admitting: Internal Medicine

## 2021-07-26 DIAGNOSIS — R2231 Localized swelling, mass and lump, right upper limb: Secondary | ICD-10-CM

## 2021-08-03 ENCOUNTER — Ambulatory Visit
Admission: RE | Admit: 2021-08-03 | Discharge: 2021-08-03 | Disposition: A | Discharge status: Home / Self Care | Payer: BLUE CROSS/BLUE SHIELD | Source: Ambulatory Visit | Attending: Internal Medicine | Admitting: Internal Medicine

## 2021-08-03 DIAGNOSIS — R2231 Localized swelling, mass and lump, right upper limb: Secondary | ICD-10-CM | POA: Diagnosis not present

## 2021-08-03 MED ORDER — IOHEXOL 300 MG/ML  SOLN
75.0000 mL | Freq: Once | INTRAMUSCULAR | Status: AC | PRN
  Administered 2021-08-03: 75 mL via INTRAVENOUS

## 2022-12-15 ENCOUNTER — Telehealth: Payer: Self-pay | Admitting: Gastroenterology

## 2022-12-15 DIAGNOSIS — Z86010 Personal history of colonic polyps: Secondary | ICD-10-CM

## 2022-12-15 NOTE — Telephone Encounter (Signed)
Patient calling to schedule repeat colonoscopy.

## 2022-12-15 NOTE — Telephone Encounter (Signed)
Returned patients phone call to schedule his colonoscopy with Dr. Allen Norris.  LVM for him to call back to schedule.  His last colonoscopy was performed by Dr. Allen Norris 07/31/2018.  He will be due for repeat on or after 08/01/23.  Thanks, [place], [place]

## 2022-12-16 ENCOUNTER — Other Ambulatory Visit: Payer: Self-pay

## 2022-12-16 DIAGNOSIS — Z86010 Personal history of colonic polyps: Secondary | ICD-10-CM

## 2022-12-16 NOTE — Addendum Note (Signed)
Addended by: Vanetta Mulders on: 12/16/2022 08:59 AM   Modules accepted: Orders

## 2022-12-16 NOTE — Telephone Encounter (Signed)
Patient returned call to schedule his repeat colonoscopy with Dr. Allen Norris. Colonoscopy scheduled for 08/01/23 at [facility].  Gastroenterology Pre-Procedure Review  Request Date: 08/01/23 Requesting Physician: Dr. Allen Norris  PATIENT REVIEW QUESTIONS: The patient responded to the following health history questions as indicated:    1. Are you having any GI issues? no 2. Do you have a personal history of Polyps? yes (last colonoscopy performed by Dr. Allen Norris on 07/31/18 polyps were present) 3. Do you have a family history of Colon Cancer or Polyps? no 4. Diabetes Mellitus? no 5. Joint replacements in the past 12 months?no 6. Major health problems in the past 3 months?no 7. Any artificial heart valves, MVP, or defibrillator?no    MEDICATIONS & ALLERGIES:    Patient reports the following regarding taking any anticoagulation/antiplatelet therapy:   Plavix, Coumadin, Eliquis, Xarelto, Lovenox, Pradaxa, Brilinta, or Effient? no Aspirin? no  Patient confirms/reports the following medications:  Current Outpatient Medications  Medication Sig Dispense Refill   Aspirin-Caffeine (BC FAST PAIN RELIEF PO) Take 1 packet by mouth 2 (two) times daily as needed (for pain.).     Cholecalciferol (VITAMIN D3) 2000 units TABS Take 2,000 Units by mouth 2 (two) times daily.      HYDROcodone-acetaminophen (NORCO/VICODIN) 5-325 MG tablet Take 1 tablet by mouth every 4 (four) hours as needed for moderate pain. 20 tablet 0   Multiple Vitamin (MULTIVITAMIN WITH MINERALS) TABS tablet Take 1 tablet by mouth at bedtime.     naproxen sodium (ALEVE) 220 MG tablet Take 220-440 mg by mouth 2 (two) times daily as needed (for pain.).     nebivolol (BYSTOLIC) 10 MG tablet Take 1 tablet (10 mg total) by mouth daily. (Patient not taking: Reported on 07/31/2018) 90 tablet 1   oxybutynin (DITROPAN) 5 MG tablet 1 tab tid prn frequency,urgency, bladder spasm 30 tablet 1   prednisoLONE Sodium Phosphate (PREDNISOL OP) Place 1 drop into the right eye  every other day. In the night     Probiotic Product (PROBIOTIC-10 PO) Take 1 capsule by mouth at bedtime.      simvastatin (ZOCOR) 40 MG tablet Take 1 tablet (40 mg total) by mouth daily at 6 PM. (Patient taking differently: Take 40 mg by mouth daily. ) 90 tablet 1   [facility] Wort (V-R [facility] WORT) 300 MG TABS Take 300 mg by mouth daily.     valACYclovir (VALTREX) 500 MG tablet Take 500 mg by mouth 2 (two) times daily.     No current facility-administered medications for this visit.    Patient confirms/reports the following allergies:  No Known Allergies  No orders of the defined types were placed in this encounter.   AUTHORIZATION INFORMATION Primary Insurance: 1D#: Group #:  Secondary Insurance: 1D#: Group #:  SCHEDULE INFORMATION: Date: 08/01/23 Time: Location: [facility]

## 2023-07-20 ENCOUNTER — Other Ambulatory Visit: Payer: Self-pay | Admitting: *Deleted

## 2023-07-20 DIAGNOSIS — Z860100 Personal history of colon polyps, unspecified: Secondary | ICD-10-CM

## 2023-07-20 MED ORDER — NA SULFATE-K SULFATE-MG SULF 17.5-3.13-1.6 GM/177ML PO SOLN
1.0000 | Freq: Once | ORAL | 0 refills | Status: AC
Start: 2023-07-20 — End: 2023-07-20

## 2023-07-25 ENCOUNTER — Telehealth: Payer: Self-pay

## 2023-07-25 ENCOUNTER — Encounter: Payer: Self-pay | Admitting: Gastroenterology

## 2023-07-25 NOTE — Telephone Encounter (Signed)
Pt requesting call back to schedule colonoscopy.

## 2023-07-26 NOTE — Telephone Encounter (Signed)
Patient Larry Webb after speaking with Johny Drilling, CMA regarding the coding of his colonoscopy.  Informed patient that his chart was reviewed and his procedure code and diagnosis code is the same as it was when he was scheduled in 2019 as follows Z86.010 history of colon polyps, and [address]109.  These codes have been given to patient to contact his insurance again to discuss.  He is waiting for another call from pre-service.  I also explained to him that he will receive a bill for what insurance does not cover.  The morning of his procedure he does not have to pay all of the amount given when he shows up.    Thanks, Marcelino Duster CMA

## 2023-07-26 NOTE — Telephone Encounter (Addendum)
Spoken to patient and inform him that when [place] schedule his colonoscopy back on 12/16/2022, it was mention that he had polyps on his last colonoscopy.  It was order as history of colon polyps. Inform patient he may call Pre-Service for more help and also for patient to check with insurance.  Noted. This is the same Dx that was used on the last colonoscopy as well.

## 2023-07-26 NOTE — Telephone Encounter (Signed)
Patient called in because his co-pay for his procedure is 2,[phone]. The patient called the hospital and his insurance they both stated that it was possible that the wrong CTP code was used.

## 2023-08-01 ENCOUNTER — Ambulatory Visit: Payer: BC Managed Care – PPO | Admitting: Anesthesiology

## 2023-08-01 ENCOUNTER — Encounter
Admission: RE | Disposition: A | Discharge status: Home / Self Care | Payer: Self-pay | Source: Home / Self Care | Attending: Gastroenterology

## 2023-08-01 ENCOUNTER — Other Ambulatory Visit: Payer: Self-pay

## 2023-08-01 ENCOUNTER — Ambulatory Visit
Admission: RE | Admit: 2023-08-01 | Discharge: 2023-08-01 | Disposition: A | Discharge status: Home / Self Care | Payer: BC Managed Care – PPO | Attending: Gastroenterology | Admitting: Gastroenterology

## 2023-08-01 ENCOUNTER — Encounter: Payer: Self-pay | Admitting: Gastroenterology

## 2023-08-01 DIAGNOSIS — D124 Benign neoplasm of descending colon: Secondary | ICD-10-CM | POA: Diagnosis not present

## 2023-08-01 DIAGNOSIS — K640 First degree hemorrhoids: Secondary | ICD-10-CM | POA: Insufficient documentation

## 2023-08-01 DIAGNOSIS — Z860100 Personal history of colon polyps, unspecified: Secondary | ICD-10-CM

## 2023-08-01 DIAGNOSIS — Z1211 Encounter for screening for malignant neoplasm of colon: Secondary | ICD-10-CM | POA: Diagnosis present

## 2023-08-01 DIAGNOSIS — Z860101 Personal history of adenomatous and serrated colon polyps: Secondary | ICD-10-CM | POA: Insufficient documentation

## 2023-08-01 DIAGNOSIS — Z09 Encounter for follow-up examination after completed treatment for conditions other than malignant neoplasm: Secondary | ICD-10-CM | POA: Diagnosis not present

## 2023-08-01 DIAGNOSIS — K635 Polyp of colon: Secondary | ICD-10-CM

## 2023-08-01 HISTORY — PX: POLYPECTOMY: SHX5525

## 2023-08-01 HISTORY — PX: COLONOSCOPY WITH PROPOFOL: SHX5780

## 2023-08-01 SURGERY — COLONOSCOPY WITH PROPOFOL
Anesthesia: General

## 2023-08-01 MED ORDER — PROPOFOL 500 MG/50ML IV EMUL
INTRAVENOUS | Status: DC | PRN
  Administered 2023-08-01: 150 ug/kg/min via INTRAVENOUS
  Administered 2023-08-01: 60 mg via INTRAVENOUS

## 2023-08-01 MED ORDER — PROPOFOL 1000 MG/100ML IV EMUL
INTRAVENOUS | Status: AC
  Filled 2023-08-01: qty 100

## 2023-08-01 MED ORDER — SODIUM CHLORIDE 0.9 % IV SOLN: INTRAVENOUS | Status: DC

## 2023-08-01 NOTE — Op Note (Signed)
[facility] Gastroenterology Patient Name: Larry Webb Procedure Date: 08/01/2023 7:22 AM MRN: [id number] Account #: [id number] Date of Birth: August 08, 1961 Admit Type: Outpatient Age: 62 Room: [facility] ENDO ROOM 4 Gender: Male Note Status: Finalized Instrument Name: Nelda Marseille [id number] Procedure:             Colonoscopy Indications:           High risk colon cancer surveillance: Personal history                         of colonic polyps Providers:             Midge Minium MD, MD Referring MD:          Daniel Nones, MD (Referring MD) Medicines:             Propofol per Anesthesia Complications:         No immediate complications. Procedure:             Pre-Anesthesia Assessment:                        - Prior to the procedure, a History and Physical was                         performed, and patient medications and allergies were                         reviewed. The patient's tolerance of previous                         anesthesia was also reviewed. The risks and benefits                         of the procedure and the sedation options and risks                         were discussed with the patient. All questions were                         answered, and informed consent was obtained. Prior                         Anticoagulants: The patient has taken no anticoagulant                         or antiplatelet agents. ASA Grade Assessment: II - A                         patient with mild systemic disease. After reviewing                         the risks and benefits, the patient was deemed in                         satisfactory condition to undergo the procedure.                        After obtaining informed consent, the colonoscope was  passed under direct vision. Throughout the procedure,                         the patient's blood pressure, pulse, and oxygen                         saturations were monitored continuously. The                          Colonoscope was introduced through the anus and                         advanced to the the cecum, identified by appendiceal                         orifice and ileocecal valve. The colonoscopy was                         performed without difficulty. The patient tolerated                         the procedure well. The quality of the bowel                         preparation was excellent. Findings:      The perianal and digital rectal examinations were normal.      A 2 to 3 mm polyp was found in the descending colon. The polyp was       sessile. The polyp was removed with a cold snare. Resection and       retrieval were complete.      Non-bleeding internal hemorrhoids were found during retroflexion. The       hemorrhoids were Grade I (internal hemorrhoids that do not prolapse). Impression:            - One 2 to 3 mm polyp in the descending colon, removed                         with a cold snare. Resected and retrieved.                        - Non-bleeding internal hemorrhoids. Recommendation:        - Discharge patient to home.                        - Resume previous diet.                        - Continue present medications.                        - Await pathology results.                        - Repeat colonoscopy in 7 years for surveillance. Procedure Code(s):     --- Professional ---                        [phone], Colonoscopy, flexible; with removal of  tumor(s), polyp(s), or other lesion(s) by snare                         technique Diagnosis Code(s):     --- Professional ---                        Z86.010, Personal history of colonic polyps                        D12.4, Benign neoplasm of descending colon CPT copyright 2022 American Medical Association. All rights reserved. The codes documented in this report are preliminary and upon coder review may  be revised to meet current compliance requirements. Midge Minium MD, MD 08/01/2023  8:18:07 AM This report has been signed electronically. Number of Addenda: 0 Note Initiated On: 08/01/2023 7:22 AM Scope Withdrawal Time: 0 hours 9 minutes 39 seconds  Total Procedure Duration: 0 hours 11 minutes 13 seconds  Estimated Blood Loss:  Estimated blood loss: none.      [facility]

## 2023-08-01 NOTE — Transfer of Care (Signed)
Immediate Anesthesia Transfer of Care Note  Patient: Larry Webb  Procedure(s) Performed: COLONOSCOPY WITH PROPOFOL POLYPECTOMY  Patient Location: PACU  Anesthesia Type:General  Level of Consciousness: awake and alert   Airway & Oxygen Therapy: Patient Spontanous Breathing and Patient connected to nasal cannula oxygen  Post-op Assessment: Report given to RN and Post -op Vital signs reviewed and stable  Post vital signs: Reviewed and stable  Last Vitals:  Vitals Value Taken Time  BP 122/67 08/01/23 0816  Temp 36.3 C 08/01/23 0816  Pulse 67 08/01/23 0816  Resp 16 08/01/23 0816  SpO2 96 % 08/01/23 0816    Last Pain:  Vitals:   08/01/23 0816  TempSrc: Temporal  PainSc: Asleep         Complications: No notable events documented.

## 2023-08-01 NOTE — Anesthesia Postprocedure Evaluation (Signed)
Anesthesia Post Note  Patient: Larry Webb  Procedure(s) Performed: COLONOSCOPY WITH PROPOFOL POLYPECTOMY  Patient location during evaluation: Endoscopy Anesthesia Type: General Level of consciousness: awake and alert Pain management: pain level controlled Vital Signs Assessment: post-procedure vital signs reviewed and stable Respiratory status: spontaneous breathing, nonlabored ventilation, respiratory function stable and patient connected to nasal cannula oxygen Cardiovascular status: blood pressure returned to baseline and stable Postop Assessment: no apparent nausea or vomiting Anesthetic complications: no   No notable events documented.   Last Vitals:  Vitals:   08/01/23 0826 08/01/23 0836  BP: 131/76 138/78  Pulse: 64 60  Resp: 13 16  Temp:    SpO2: 95% 98%    Last Pain:  Vitals:   08/01/23 0836  TempSrc:   PainSc: 0-No pain                 Corinda Gubler

## 2023-08-01 NOTE — H&P (Signed)
Midge Minium, MD [facility] [address] [place], [place] [address] [phone] Fax : [phone]  Primary Care Physician:  Lynnea Ferrier, MD Primary Gastroenterologist:  Dr. Servando Snare  Pre-Procedure History & Physical: HPI:  Larry Webb is a 62 y.o. male is here for an colonoscopy.   Past Medical History:  Diagnosis Date   High cholesterol    History of kidney stones    History of shingles    right eye   Hyperlipidemia    Hypertension     Past Surgical History:  Procedure Laterality Date   COLONOSCOPY  2014   COLONOSCOPY WITH PROPOFOL N/A 07/31/2018   Procedure: COLONOSCOPY WITH PROPOFOL;  Surgeon: Midge Minium, MD;  Location: [facility] ENDOS[facility];  Service: Endoscopy;  Laterality: N/A;   CYSTOSCOPY/URETEROSCOPY/HOLMIUM LASER/STENT PLACEMENT Right 01/30/2018   Procedure: CYSTOSCOPY/URETEROSCOPY/HOLMIUM LASER/STENT PLACEMENT;  Surgeon: Riki Altes, MD;  Location: [facility] ORS;  Service: Urology;  Laterality: Right;   HERNIA REPAIR     right inguinal    Prior to Admission medications   Medication Sig Start Date End Date Taking? Authorizing Provider  Aspirin-Caffeine (BC FAST PAIN RELIEF PO) Take 1 packet by mouth 2 (two) times daily as needed (for pain.).   Yes [provider]  Cholecalciferol (VITAMIN D3) 2000 units TABS Take 2,000 Units by mouth 2 (two) times daily.    Yes [provider]  HYDROcodone-acetaminophen (NORCO/VICODIN) 5-325 MG tablet Take 1 tablet by mouth every 4 (four) hours as needed for moderate pain. 01/30/18  Yes Stoioff, Verna Czech, MD  Multiple Vitamin (MULTIVITAMIN WITH MINERALS) TABS tablet Take 1 tablet by mouth at bedtime.   Yes [provider]  naproxen sodium (ALEVE) 220 MG tablet Take 220-440 mg by mouth 2 (two) times daily as needed (for pain.).   Yes [provider]  oxybutynin (DITROPAN) 5 MG tablet 1 tab tid prn frequency,urgency, bladder spasm 01/30/18  Yes Stoioff, Verna Czech, MD  Probiotic Product  (PROBIOTIC-10 PO) Take 1 capsule by mouth at bedtime.    Yes [provider]  [facility] Wort (V-R [facility] WORT) 300 MG TABS Take 300 mg by mouth daily.   Yes [provider]  valACYclovir (VALTREX) 500 MG tablet Take 500 mg by mouth 2 (two) times daily.   Yes [provider]  nebivolol (BYSTOLIC) 10 MG tablet Take 1 tablet (10 mg total) by mouth daily. Patient not taking: Reported on 07/31/2018 04/10/15   Ellyn Hack, MD  prednisoLONE Sodium Phosphate (PREDNISOL OP) Place 1 drop into the right eye every other day. In the night    [provider]  simvastatin (ZOCOR) 40 MG tablet Take 1 tablet (40 mg total) by mouth daily at 6 PM. Patient taking differently: Take 40 mg by mouth daily.  04/10/15   Ellyn Hack, MD    Allergies as of 12/16/2022   (No Known Allergies)    Family History  Problem Relation Age of Onset   Vision loss Mother    Hearing loss Mother    Pneumonia Father     Social History   Socioeconomic History   Marital status: Single    Spouse name: Not on file   Number of children: Not on file   Years of education: Not on file   Highest education level: Not on file  Occupational History   Not on file  Tobacco Use   Smoking status: Former   Smokeless tobacco: Never  Vaping Use   Vaping  status: Never Used  Substance and Sexual Activity   Alcohol use: Yes    Alcohol/week: 1.0 standard drink of alcohol    Types: 1 Shots of liquor per week   Drug use: No   Sexual activity: Not on file  Other Topics Concern   Not on file  Social History Narrative   Not on file   Social Determinants of Health   Financial Resource Strain: Low Risk  (03/21/2023)   Received from [facility]   Overall Financial Resource Strain (CARDIA)    Difficulty of Paying Living Expenses: Not hard at all  Food Insecurity: No Food Insecurity (03/21/2023)   Received from [facility]   Hunger Vital Sign    Worried About  Running Out of Food in the Last Year: Never true    Ran Out of Food in the Last Year: Never true  Transportation Needs: No Transportation Needs (03/21/2023)   Received from [facility] - Transportation    In the past 12 months, has lack of transportation kept you from medical appointments or from getting medications?: No    Lack of Transportation (Non-Medical): No  Physical Activity: Not on file  Stress: Not on file  Social Connections: Not on file  Intimate Partner Violence: Not on file    Review of Systems: See HPI, otherwise negative ROS  Physical Exam: BP (!) 153/85   Pulse 70   Temp 98.3 F (36.8 C) (Skin)   Wt 102.1 kg   SpO2 96%   BMI 38.62 kg/m  General:   Alert,  pleasant and cooperative in NAD Head:  Normocephalic and atraumatic. Neck:  Supple; no masses or thyromegaly. Lungs:  Clear throughout to auscultation.    Heart:  Regular rate and rhythm. Abdomen:  Soft, nontender and nondistended. Normal bowel sounds, without guarding, and without rebound.   Neurologic:  Alert and  oriented x4;  grossly normal neurologically.  Impression/Plan: Larry Webb is here for an colonoscopy to be performed for a history of adenomatous polyps on 2019   Risks, benefits, limitations, and alternatives regarding  colonoscopy have been reviewed with the patient.  Questions have been answered.  All parties agreeable.   Midge Minium, MD  08/01/2023, 7:47 AM

## 2023-08-01 NOTE — Anesthesia Preprocedure Evaluation (Signed)
Anesthesia Evaluation  Patient identified by MRN, date of birth, ID band Patient awake    Reviewed: Allergy & Precautions, NPO status , Patient's Chart, lab work & pertinent test results  History of Anesthesia Complications Negative for: history of anesthetic complications  Airway Mallampati: II  TM Distance: >3 FB Neck ROM: Full    Dental no notable dental hx. (+) Teeth Intact   Pulmonary neg pulmonary ROS, neg sleep apnea, neg COPD, Patient abstained from smoking.Not current smoker, former smoker   Pulmonary exam normal breath sounds clear to auscultation       Cardiovascular Exercise Tolerance: Good METShypertension, Pt. on medications (-) CAD and (-) Past MI (-) dysrhythmias  Rhythm:Regular Rate:Normal - Systolic murmurs    Neuro/Psych negative neurological ROS  negative psych ROS   GI/Hepatic ,neg GERD  ,,(+)     (-) substance abuse    Endo/Other  neg diabetes    Renal/GU negative Renal ROS     Musculoskeletal   Abdominal   Peds  Hematology   Anesthesia Other Findings Past Medical History: No date: High cholesterol No date: History of kidney stones No date: History of shingles     Comment:  right eye No date: Hyperlipidemia No date: Hypertension  Reproductive/Obstetrics                             Anesthesia Physical Anesthesia Plan  ASA: 2  Anesthesia Plan: General   Post-op Pain Management: Minimal or no pain anticipated   Induction: Intravenous  PONV Risk Score and Plan: 2 and Propofol infusion, TIVA and Ondansetron  Airway Management Planned: Nasal Cannula  Additional Equipment: None  Intra-op Plan:   Post-operative Plan:   Informed Consent: I have reviewed the patients History and Physical, chart, labs and discussed the procedure including the risks, benefits and alternatives for the proposed anesthesia with the patient or authorized representative who has  indicated his/her understanding and acceptance.     Dental advisory given  Plan Discussed with: CRNA and Surgeon  Anesthesia Plan Comments: (Discussed risks of anesthesia with patient, including possibility of difficulty with spontaneous ventilation under anesthesia necessitating airway intervention, PONV, and rare risks such as cardiac or respiratory or neurological events, and allergic reactions. Discussed the role of CRNA in patient's perioperative care. Patient understands.)       Anesthesia Quick Evaluation

## 2023-08-02 ENCOUNTER — Encounter: Payer: Self-pay | Admitting: Gastroenterology

## 2023-08-04 ENCOUNTER — Encounter: Payer: Self-pay | Admitting: Gastroenterology

## 2023-08-04 LAB — SURGICAL PATHOLOGY

## 2024-08-12 ENCOUNTER — Other Ambulatory Visit: Payer: Self-pay

## 2024-08-12 DIAGNOSIS — N200 Calculus of kidney: Secondary | ICD-10-CM

## 2024-08-12 DIAGNOSIS — Z87440 Personal history of urinary (tract) infections: Secondary | ICD-10-CM

## 2024-08-16 ENCOUNTER — Ambulatory Visit: Payer: Self-pay | Admitting: Urology

## 2024-08-16 VITALS — BP 136/75 | HR 62 | Wt 229.0 lb

## 2024-08-16 DIAGNOSIS — Z125 Encounter for screening for malignant neoplasm of prostate: Secondary | ICD-10-CM

## 2024-08-16 DIAGNOSIS — R1024 Suprapubic pain: Secondary | ICD-10-CM

## 2024-08-16 DIAGNOSIS — N200 Calculus of kidney: Secondary | ICD-10-CM | POA: Diagnosis not present

## 2024-08-16 NOTE — Patient Instructions (Addendum)

## 2024-08-16 NOTE — Progress Notes (Signed)
 08/16/24 8:09 AM   Larry Webb 06-09-1961 [id number]  CC: Recurrent nephrolithiasis, PSA screening  HPI: 63 year old male with recurrent calcium oxalate nephrolithiasis, he thinks he has passed over 20 prior stones.  Required ureteroscopy on the right side with Dr. Twylla in 2019.  A follow-up renal ultrasound actually showed some mild hydronephrosis but he never followed up.  Recently presented to urgent care with severe dysuria and pelvic pain, UA relatively benign with 2 RBC and 2 WBC, treated empirically for a kidney stone and symptoms have resolved after he passed a few small stones.  He really denies any flank pain.  No symptoms at this time.  No recent imaging to review.  PSA November 2024 0.71.  PMH: Past Medical History:  Diagnosis Date   High cholesterol    History of kidney stones    History of shingles    right eye   Hyperlipidemia    Hypertension     Surgical History: Past Surgical History:  Procedure Laterality Date   COLONOSCOPY  2014   COLONOSCOPY WITH PROPOFOL  N/A 07/31/2018   Procedure: COLONOSCOPY WITH PROPOFOL ;  Surgeon: Jinny Carmine, MD;  Location: [facility];  Service: Endoscopy;  Laterality: N/A;   COLONOSCOPY WITH PROPOFOL  N/A 08/01/2023   Procedure: COLONOSCOPY WITH PROPOFOL ;  Surgeon: Jinny Carmine, MD;  Location: [facility];  Service: Endoscopy;  Laterality: N/A;   CYSTOSCOPY/URETEROSCOPY/HOLMIUM LASER/STENT PLACEMENT Right 01/30/2018   Procedure: CYSTOSCOPY/URETEROSCOPY/HOLMIUM LASER/STENT PLACEMENT;  Surgeon: Twylla Glendia BROCKS, MD;  Location: [facility] OR[facility];  Service: Urology;  Laterality: Right;   HERNIA REPAIR     right inguinal   POLYPECTOMY  08/01/2023   Procedure: POLYPECTOMY;  Surgeon: Jinny Carmine, MD;  Location: [facility];  Service: Endoscopy;;     Family History: Family History  Problem Relation Age of Onset   Vision loss Mother    Hearing loss Mother    Pneumonia Father     Social History:  reports that he has quit  smoking. He has never used smokeless tobacco. He reports current alcohol use of about 1.0 standard drink of alcohol per week. He reports that he does not use drugs.  Physical Exam: BP 136/75 (BP Location: Left Arm, Patient Position: Sitting, Cuff Size: Large)   Pulse 62   Wt 229 lb (103.9 kg)   SpO2 96%   BMI 39.31 kg/m    Constitutional:  Alert and oriented, No acute distress. Cardiovascular: No clubbing, cyanosis, or edema. Respiratory: Normal respiratory effort, no increased work of breathing. GI: Abdomen is soft, nontender, nondistended, no abdominal masses   Laboratory Data: Reviewed, see HPI  Pertinent Imaging: I have personally viewed and interpreted the prior CT from March 2019 with right sided ureteral stone that was ultimately treated with URS, and bilateral nonobstructive renal stones, the follow-up renal ultrasound from June 2019 with mild right hydronephrosis.  Assessment & Plan:   63 year old male with recurrent calcium oxalate stones.  He also has a history of ureteroscopy in 2019 and a follow-up ultrasound showed some mild hydronephrosis, he never followed up after that.  PSA normal at 0.71, can continue screening every other year through age 29 with PCP.  We discussed general stone prevention strategies including adequate hydration with goal of producing 2.5 L of urine daily, increasing citric acid intake, increasing calcium intake during high oxalate meals, minimizing animal protein, and decreasing salt intake. Information about dietary recommendations given today.   CT for further evaluation of stone burden, as well as history of right sided hydronephrosis after  ureteroscopy in 2019 24-hour urine metabolic workup, call with results   Redell Burnet, MD 08/16/2024  [facility] [address] [place], [place] [address] [phone]

## 2024-08-21 ENCOUNTER — Ambulatory Visit
Admission: RE | Admit: 2024-08-21 | Discharge: 2024-08-21 | Disposition: A | Discharge status: Home / Self Care | Source: Ambulatory Visit | Attending: Urology | Admitting: Urology

## 2024-08-21 DIAGNOSIS — R1024 Suprapubic pain: Secondary | ICD-10-CM | POA: Insufficient documentation

## 2024-08-23 ENCOUNTER — Ambulatory Visit

## 2024-08-26 ENCOUNTER — Ambulatory Visit: Payer: Self-pay | Admitting: Urology

## 2024-08-26 DIAGNOSIS — N200 Calculus of kidney: Secondary | ICD-10-CM

## 2024-08-27 ENCOUNTER — Other Ambulatory Visit

## 2024-08-27 DIAGNOSIS — N200 Calculus of kidney: Secondary | ICD-10-CM

## 2024-08-28 LAB — LITHOLINK SERUM PANEL
CO2: 24 mmol/L (ref 20–29)
Calcium: 9.9 mg/dL (ref 8.6–10.2)
Chloride: 99 mmol/L (ref 96–106)
Creatinine, Ser: 1.15 mg/dL (ref 0.76–1.27)
Magnesium: 1.9 mg/dL (ref 1.6–2.3)
Phosphorus: 2.9 mg/dL (ref 2.8–4.1)
Potassium: 4.1 mmol/L (ref 3.5–5.2)
Sodium: 138 mmol/L (ref 134–144)
Uric Acid: 7.3 mg/dL (ref 3.8–8.4)
eGFR: 71 mL/min/1.73 (ref >59)

## 2024-08-31 LAB — LITHOLINK 24HR URINE PANEL
Ammonium, Urine: 22 mmol/(24.h) (ref 15–60)
Calcium Oxalate Saturation: 1.98 — ABNORMAL LOW (ref 6.00–10.00)
Calcium Phosphate Saturation: 0.13 — ABNORMAL LOW (ref 0.50–2.00)
Calcium, Urine: 35 mg/(24.h) (ref <250)
Calcium/Creatinine Ratio: 24 mg/g{creat} — ABNORMAL LOW (ref 34–196)
Calcium/Kg Body Weight: 0.3 mg/kg/d (ref <4.0)
Chloride, Urine: 98 mmol/(24.h) (ref 70–250)
Citrate, Urine: 440 mg/(24.h) — ABNORMAL LOW (ref >450)
Creatinine, Urine: 1425 mg/(24.h)
Creatinine/Kg Body Weight: 14 mg/kg/d (ref 11.9–24.4)
Cystine, Urine, Qualitative: NEGATIVE
Magnesium, Urine: 60 mg/(24.h) (ref 30–120)
Oxalate, Urine: 20 mg/(24.h) (ref 20–40)
Phosphorus, Urine: 580 mg/(24.h) — ABNORMAL LOW (ref 600–1200)
Potassium, Urine: 34 mmol/(24.h) (ref 20–100)
Protein Catabolic Rate: 0.5 g/kg/d — ABNORMAL LOW (ref 0.8–1.4)
Sodium, Urine: 99 mmol/(24.h) (ref 50–150)
Sulfate, Urine: 19 meq/(24.h) — ABNORMAL LOW (ref 20–80)
Urea Nitrogen, Urine: 4.75 g/(24.h) — ABNORMAL LOW (ref 6.00–14.00)
Uric Acid Saturation: 1.57 — ABNORMAL HIGH (ref <1.00)
Uric Acid, Urine: 471 mg/(24.h) (ref <800)
Urine Volume (Preserved): 1170 mL/(24.h) (ref 500–4000)
pH, 24 hr, Urine: 5.613 — ABNORMAL LOW (ref 5.800–6.200)

## 2024-09-02 NOTE — Telephone Encounter (Signed)
 Appt scheduled. KUB ordered.

## 2025-09-02 ENCOUNTER — Ambulatory Visit: Admitting: Urology
